# Patient Record
Sex: Female | Born: 1935 | Race: White | Hispanic: No | Smoking: Former smoker
Health system: Southern US, Community
[De-identification: ages and names within clinical notes are randomized; demographics above are authoritative.]

## PROBLEM LIST (undated history)

## (undated) DIAGNOSIS — K589 Irritable bowel syndrome without diarrhea: Secondary | ICD-10-CM

## (undated) DIAGNOSIS — A09 Infectious gastroenteritis and colitis, unspecified: Secondary | ICD-10-CM

## (undated) DIAGNOSIS — N289 Disorder of kidney and ureter, unspecified: Secondary | ICD-10-CM

## (undated) DIAGNOSIS — E785 Hyperlipidemia, unspecified: Secondary | ICD-10-CM

## (undated) DIAGNOSIS — K529 Noninfective gastroenteritis and colitis, unspecified: Secondary | ICD-10-CM

## (undated) DIAGNOSIS — E119 Type 2 diabetes mellitus without complications: Secondary | ICD-10-CM

## (undated) DIAGNOSIS — R110 Nausea: Secondary | ICD-10-CM

## (undated) DIAGNOSIS — I10 Essential (primary) hypertension: Secondary | ICD-10-CM

## (undated) DIAGNOSIS — K219 Gastro-esophageal reflux disease without esophagitis: Secondary | ICD-10-CM

## (undated) DIAGNOSIS — K5790 Diverticulosis of intestine, part unspecified, without perforation or abscess without bleeding: Secondary | ICD-10-CM

## (undated) HISTORY — PX: CHOLECYSTECTOMY: SHX55

## (undated) HISTORY — PX: UPPER GASTROINTESTINAL ENDOSCOPY: SHX188

## (undated) HISTORY — PX: ABDOMINAL HYSTERECTOMY: SHX81

## (undated) HISTORY — PX: OTHER SURGICAL HISTORY: SHX169

## (undated) HISTORY — PX: COLONOSCOPY: SHX174

## (undated) HISTORY — PX: BACK SURGERY: SHX140

---

## 2008-06-14 ENCOUNTER — Ambulatory Visit (HOSPITAL_COMMUNITY): Admission: RE | Admit: 2008-06-14 | Discharge: 2008-06-14 | Payer: Self-pay | Admitting: Urology

## 2008-06-28 ENCOUNTER — Ambulatory Visit (HOSPITAL_BASED_OUTPATIENT_CLINIC_OR_DEPARTMENT_OTHER): Admission: RE | Admit: 2008-06-28 | Discharge: 2008-06-28 | Payer: Self-pay | Admitting: Urology

## 2009-04-10 IMAGING — CR DG CHEST 2V
2 series · 2 of 2 positions shown · non-contrast
Comparison: No priors

CLINICAL DATA: Preop for ureteral stent repositioning

CHEST - 2 VIEW

[w chest pa]
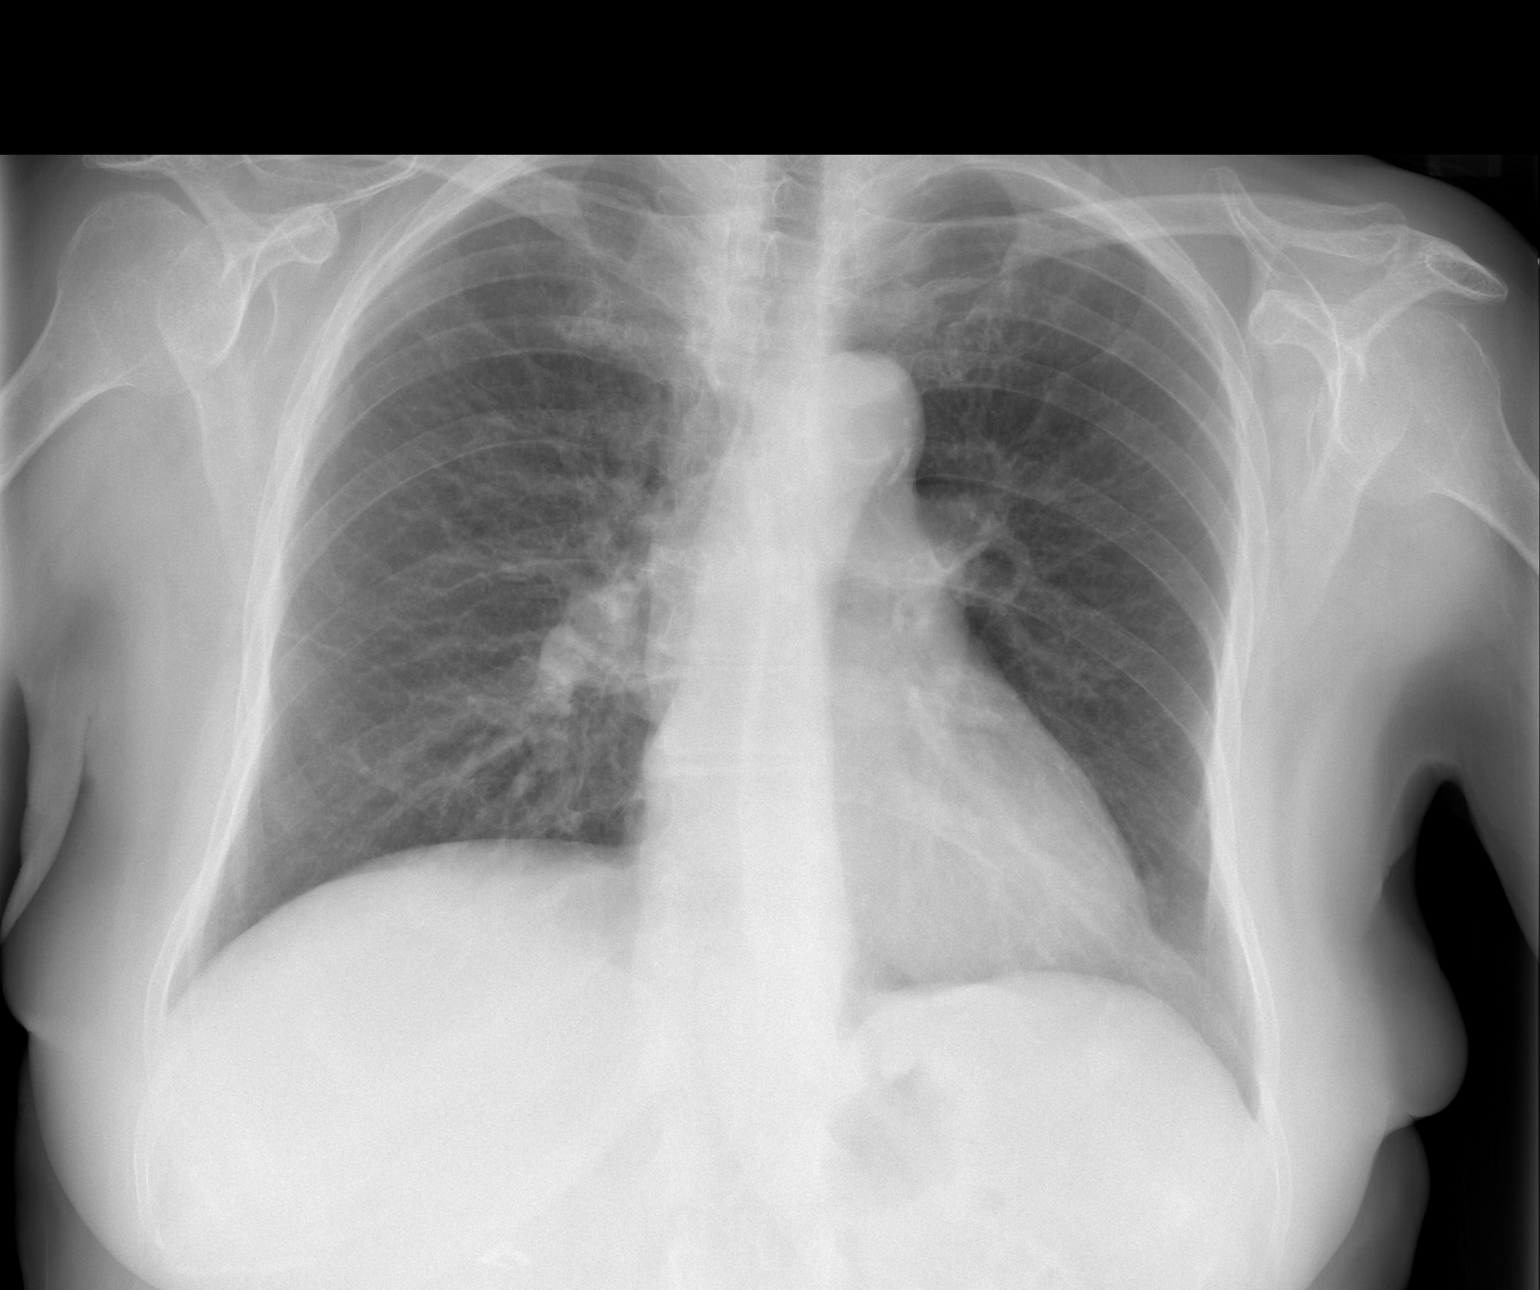

[w chest lat]
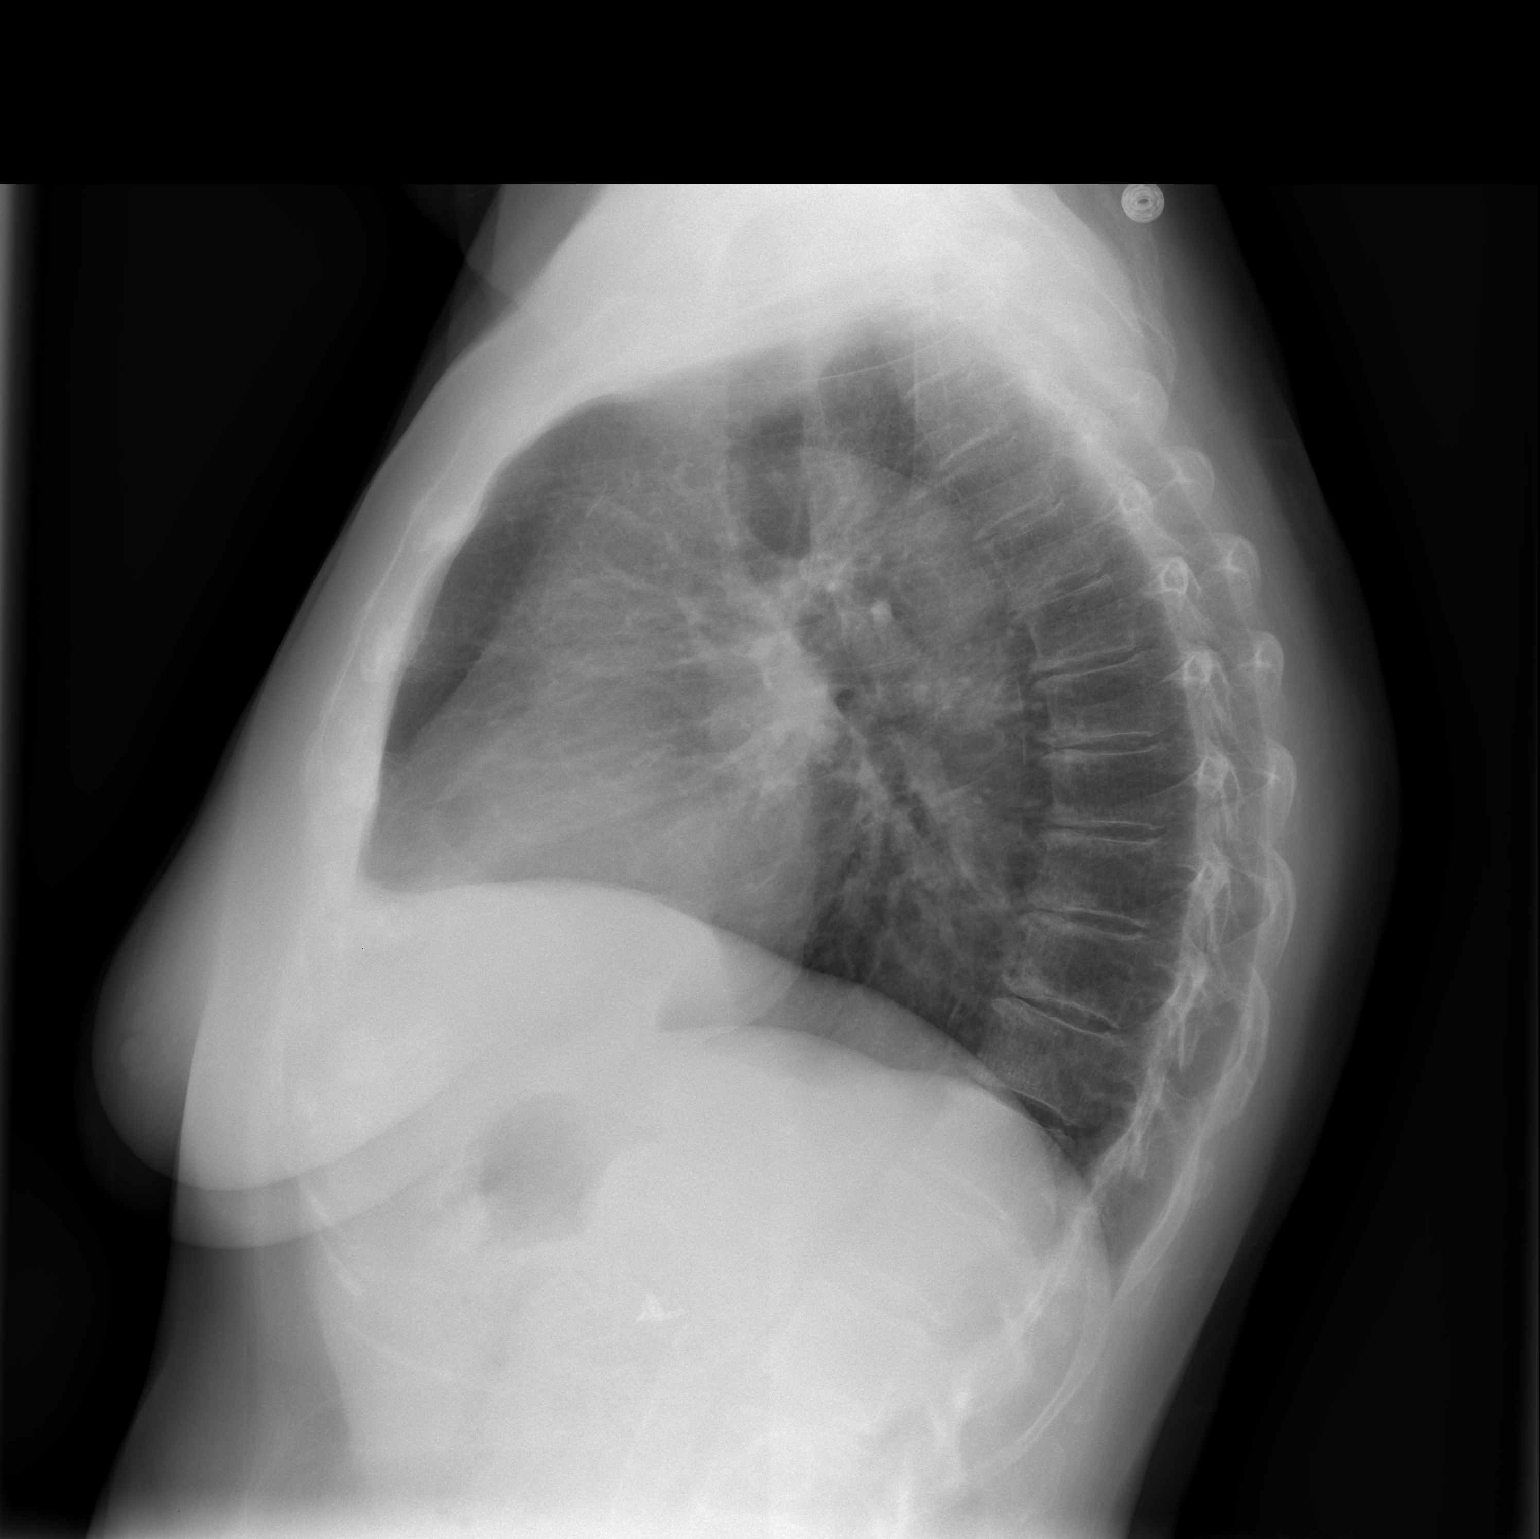

[2 of 2 positions shown; findings below may reference images not displayed]

FINDINGS: Heart and mediastinal contours normal.  Lungs clear.
Osseous structures intact.
IMPRESSION: No active disease.

## 2011-05-15 NOTE — Op Note (Signed)
NAME:  Jodi Webb, Jodi Webb                ACCOUNT NO.:  [id number]   MEDICAL RECORD NO.:  [id number]          PATIENT TYPE:  AMB   LOCATION:  [facility]                         FACILITY:  [facility]   PHYSICIAN:  Mark C. Vernie Ammons, M.D.  DATE OF BIRTH:  [birth year]   DATE OF PROCEDURE:  06/28/2008  DATE OF DISCHARGE:                               OPERATIVE REPORT   PREOPERATIVE DIAGNOSIS:  Retained/migrated right ureteral stent.   POSTOPERATIVE DIAGNOSIS:  Retained/migrated right ureteral stent.   PROCEDURE:  Cystoscopy and stent removal.   SURGEON:  Mark C. Vernie Ammons, M.D.   ANESTHESIA:  General.   SPECIMENS:  None.   BLOOD LOSS:  None.   DRAINS:  None.   COMPLICATIONS:  None.   INDICATIONS:  The patient is a 77 year old white female who had an  emergent right ureteral stent placed in [place] due to urosepsis  due to an obstructing stone.  She was treated and her stone remained  untreated in her right kidney, but no obstruction.  She therefore  underwent lithotripsy with excellent fragmentation of the stone and  returned to my office to have her stent removed.  When I performed  cystoscopy there, the stent could not be visualized, nor was there any  string/tether visible within the bladder.  I therefore brought her to  the operating room for ureteroscopic extraction of the stent.  The  risks, complications and alternatives were discussed.  She understands  and elected to proceed.   DESCRIPTION OF OPERATION:  After informed consent, patient brought to  the major OR, placed on the table, administered general anesthesia and  then moved in the dorsal lithotomy position.  Her genitalia were  sterilely prepped and draped and an official time-out was then  performed.  The 22-French cystoscope was then introduced into the  bladder under direct visualization.  The bladder was noted to be free of  any tumor, stones or inflammatory lesions, although there was some  inflammation in the area of the  right ureteral orifice.  I identified  string/tether exiting the right orifice that was not visible in my  office the other day.  I therefore used the alligator forceps to grasp  the tether and extracted the stent completely and without difficulty.  The bladder was then drained, and the patient was awakened and taken to  recovery room in stable satisfactory condition.  She tolerated the  procedure well.  There were no intraoperative complications.   She will follow-up with me in 1 week to make sure that her bladder  infection, that she was previously noted to have and is currently being  treated with antibiotics, has cleared.      Mark C. Vernie Ammons, M.D.  Electronically Signed     MCO/MEDQ  D:  06/28/2008  T:  06/28/2008  Job:  [id number]

## 2011-09-27 LAB — POCT I-STAT 4, (NA,K, GLUC, HGB,HCT)
Glucose, Bld: 117 — ABNORMAL HIGH
HCT: 41
Hemoglobin: 13.9
Operator id: 268271
Potassium: 3.5
Sodium: 143

## 2013-01-17 DIAGNOSIS — I219 Acute myocardial infarction, unspecified: Secondary | ICD-10-CM

## 2013-10-01 ENCOUNTER — Encounter (INDEPENDENT_AMBULATORY_CARE_PROVIDER_SITE_OTHER): Payer: Self-pay | Admitting: *Deleted

## 2013-10-01 ENCOUNTER — Encounter (INDEPENDENT_AMBULATORY_CARE_PROVIDER_SITE_OTHER): Payer: Self-pay

## 2013-10-15 ENCOUNTER — Other Ambulatory Visit (INDEPENDENT_AMBULATORY_CARE_PROVIDER_SITE_OTHER): Payer: Self-pay | Admitting: *Deleted

## 2013-10-15 ENCOUNTER — Telehealth (INDEPENDENT_AMBULATORY_CARE_PROVIDER_SITE_OTHER): Payer: Self-pay | Admitting: *Deleted

## 2013-10-15 DIAGNOSIS — Z800 Family history of malignant neoplasm of digestive organs: Secondary | ICD-10-CM

## 2013-10-15 DIAGNOSIS — Z1211 Encounter for screening for malignant neoplasm of colon: Secondary | ICD-10-CM

## 2013-10-15 MED ORDER — PEG-KCL-NACL-NASULF-NA ASC-C 100 G PO SOLR: 1.0000 | Freq: Once | ORAL | Status: DC

## 2013-10-15 NOTE — Telephone Encounter (Signed)
Patient needs movi prep 

## 2013-10-20 ENCOUNTER — Encounter (INDEPENDENT_AMBULATORY_CARE_PROVIDER_SITE_OTHER): Payer: Self-pay | Admitting: *Deleted

## 2013-11-02 ENCOUNTER — Telehealth (INDEPENDENT_AMBULATORY_CARE_PROVIDER_SITE_OTHER): Payer: Self-pay | Admitting: *Deleted

## 2013-11-02 NOTE — Telephone Encounter (Signed)
  Procedure: tcs  Reason/Indication:  fam hx colon ca  Has patient had this procedure before?  Yes, 2009 (scanned)  If so, when, by whom and where?    Is there a family history of colon cancer?  Yes, sister  Who?  What age when diagnosed?    Is patient diabetic?   no      Does patient have prosthetic heart valve?  no  Do you have a pacemaker?  no  Has patient ever had endocarditis? no  Has patient had joint replacement within last 12 months?  no  Does patient tend to be constipated or take laxatives? no  Is patient on Coumadin, Plavix and/or Aspirin? no  Medications: fish oil 1000 mg daily, multi vit daily, potassium bid, vit b3 daily, probiotics daily, cymbalta 60 mg daily, diltiazem 120 mg daily, hctz 12.5 mg daily, xanax prn, nexium 40 mg daily, nitrofurantoin 50 mg daily  Allergies: nkda  Medication Adjustment:   Procedure date & time: 11/25/13 at 1200

## 2013-11-04 NOTE — Telephone Encounter (Signed)
agree

## 2013-11-18 ENCOUNTER — Encounter (HOSPITAL_COMMUNITY): Payer: Self-pay | Admitting: Pharmacy Technician

## 2013-12-14 ENCOUNTER — Encounter (INDEPENDENT_AMBULATORY_CARE_PROVIDER_SITE_OTHER): Payer: Self-pay | Admitting: *Deleted

## 2013-12-30 ENCOUNTER — Telehealth (INDEPENDENT_AMBULATORY_CARE_PROVIDER_SITE_OTHER): Payer: Self-pay | Admitting: *Deleted

## 2013-12-30 NOTE — Telephone Encounter (Signed)
  Procedure: tcs  Reason/Indication:  fam hx colon ca  Has patient had this procedure before?  Yes, 2009 --scanned  If so, when, by whom and where?    Is there a family history of colon cancer?  Yes, sister  Who?  What age when diagnosed?    Is patient diabetic?   no      Does patient have prosthetic heart valve?  no  Do you have a pacemaker?  no  Has patient ever had endocarditis? no  Has patient had joint replacement within last 12 months?  no  Does patient tend to be constipated or take laxatives? no  Is patient on Coumadin, Plavix and/or Aspirin? no  Medications: fish oil 1000 mg daily, multi vit daily, potassium bid, vit b3 daily, probiotics daily, Cymbalta 60 mg daily, diltiazem 120 mg daily, hctz 12.5 mg daily, xanax prn, nexium 40 mg daily, nitrofurantoin 50 mg daily  Allergies: nkda  Medication Adjustment:   Procedure date & time: 01/28/14 at 730

## 2013-12-30 NOTE — Telephone Encounter (Signed)
agree

## 2014-01-28 ENCOUNTER — Encounter (HOSPITAL_COMMUNITY): Admission: RE | Payer: Self-pay | Source: Ambulatory Visit

## 2014-01-28 ENCOUNTER — Ambulatory Visit (HOSPITAL_COMMUNITY): Admission: RE | Admit: 2014-01-28 | Payer: Medicare Other | Source: Ambulatory Visit | Admitting: Internal Medicine

## 2014-01-28 SURGERY — COLONOSCOPY
Anesthesia: Moderate Sedation

## 2016-03-31 ENCOUNTER — Emergency Department (HOSPITAL_COMMUNITY): Payer: Medicare Other

## 2016-03-31 ENCOUNTER — Inpatient Hospital Stay (HOSPITAL_COMMUNITY)
Admission: EM | Admit: 2016-03-31 | Discharge: 2016-04-04 | DRG: 391 | Disposition: A | Discharge status: Home / Self Care | Payer: Medicare Other | Attending: Internal Medicine | Admitting: Internal Medicine

## 2016-03-31 ENCOUNTER — Encounter (HOSPITAL_COMMUNITY): Payer: Self-pay | Admitting: *Deleted

## 2016-03-31 DIAGNOSIS — Z87440 Personal history of urinary (tract) infections: Secondary | ICD-10-CM

## 2016-03-31 DIAGNOSIS — Z90710 Acquired absence of both cervix and uterus: Secondary | ICD-10-CM

## 2016-03-31 DIAGNOSIS — E119 Type 2 diabetes mellitus without complications: Secondary | ICD-10-CM

## 2016-03-31 DIAGNOSIS — J9611 Chronic respiratory failure with hypoxia: Secondary | ICD-10-CM | POA: Diagnosis present

## 2016-03-31 DIAGNOSIS — I10 Essential (primary) hypertension: Secondary | ICD-10-CM | POA: Diagnosis not present

## 2016-03-31 DIAGNOSIS — Z9049 Acquired absence of other specified parts of digestive tract: Secondary | ICD-10-CM

## 2016-03-31 DIAGNOSIS — J9691 Respiratory failure, unspecified with hypoxia: Secondary | ICD-10-CM | POA: Diagnosis present

## 2016-03-31 DIAGNOSIS — Z803 Family history of malignant neoplasm of breast: Secondary | ICD-10-CM

## 2016-03-31 DIAGNOSIS — N390 Urinary tract infection, site not specified: Secondary | ICD-10-CM | POA: Diagnosis present

## 2016-03-31 DIAGNOSIS — N289 Disorder of kidney and ureter, unspecified: Secondary | ICD-10-CM | POA: Insufficient documentation

## 2016-03-31 DIAGNOSIS — K589 Irritable bowel syndrome without diarrhea: Secondary | ICD-10-CM | POA: Insufficient documentation

## 2016-03-31 DIAGNOSIS — R0902 Hypoxemia: Secondary | ICD-10-CM

## 2016-03-31 DIAGNOSIS — A09 Infectious gastroenteritis and colitis, unspecified: Secondary | ICD-10-CM | POA: Diagnosis not present

## 2016-03-31 DIAGNOSIS — E785 Hyperlipidemia, unspecified: Secondary | ICD-10-CM | POA: Diagnosis present

## 2016-03-31 DIAGNOSIS — K921 Melena: Secondary | ICD-10-CM | POA: Diagnosis present

## 2016-03-31 DIAGNOSIS — K529 Noninfective gastroenteritis and colitis, unspecified: Secondary | ICD-10-CM

## 2016-03-31 DIAGNOSIS — D62 Acute posthemorrhagic anemia: Secondary | ICD-10-CM | POA: Diagnosis present

## 2016-03-31 DIAGNOSIS — Z800 Family history of malignant neoplasm of digestive organs: Secondary | ICD-10-CM

## 2016-03-31 DIAGNOSIS — Z87442 Personal history of urinary calculi: Secondary | ICD-10-CM

## 2016-03-31 DIAGNOSIS — Z801 Family history of malignant neoplasm of trachea, bronchus and lung: Secondary | ICD-10-CM

## 2016-03-31 DIAGNOSIS — K219 Gastro-esophageal reflux disease without esophagitis: Secondary | ICD-10-CM | POA: Diagnosis present

## 2016-03-31 DIAGNOSIS — K5730 Diverticulosis of large intestine without perforation or abscess without bleeding: Secondary | ICD-10-CM | POA: Diagnosis present

## 2016-03-31 DIAGNOSIS — K5790 Diverticulosis of intestine, part unspecified, without perforation or abscess without bleeding: Secondary | ICD-10-CM | POA: Insufficient documentation

## 2016-03-31 DIAGNOSIS — Z87891 Personal history of nicotine dependence: Secondary | ICD-10-CM

## 2016-03-31 DIAGNOSIS — E782 Mixed hyperlipidemia: Secondary | ICD-10-CM | POA: Insufficient documentation

## 2016-03-31 HISTORY — DX: Diverticulosis of intestine, part unspecified, without perforation or abscess without bleeding: K57.90

## 2016-03-31 HISTORY — DX: Disorder of kidney and ureter, unspecified: N28.9

## 2016-03-31 HISTORY — DX: Hyperlipidemia, unspecified: E78.5

## 2016-03-31 HISTORY — DX: Infectious gastroenteritis and colitis, unspecified: A09

## 2016-03-31 HISTORY — DX: Irritable bowel syndrome, unspecified: K58.9

## 2016-03-31 HISTORY — DX: Noninfective gastroenteritis and colitis, unspecified: K52.9

## 2016-03-31 HISTORY — DX: Essential (primary) hypertension: I10

## 2016-03-31 HISTORY — DX: Type 2 diabetes mellitus without complications: E11.9

## 2016-03-31 HISTORY — DX: Gastro-esophageal reflux disease without esophagitis: K21.9

## 2016-03-31 LAB — COMPREHENSIVE METABOLIC PANEL
ALBUMIN: 4.2 g/dL (ref 3.5–5.0)
ALT: 28 U/L (ref 14–54)
ANION GAP: 10 (ref 5–15)
AST: 31 U/L (ref 15–41)
Alkaline Phosphatase: 80 U/L (ref 38–126)
BUN: 21 mg/dL — AB (ref 6–20)
CO2: 26 mmol/L (ref 22–32)
Calcium: 9.2 mg/dL (ref 8.9–10.3)
Chloride: 104 mmol/L (ref 101–111)
Creatinine, Ser: 0.67 mg/dL (ref 0.44–1.00)
GFR calc Af Amer: >60 mL/min (ref >60)
GFR calc non Af Amer: >60 mL/min (ref >60)
GLUCOSE: 151 mg/dL — AB (ref 65–99)
POTASSIUM: 4.3 mmol/L (ref 3.5–5.1)
SODIUM: 140 mmol/L (ref 135–145)
Total Bilirubin: 0.5 mg/dL (ref 0.3–1.2)
Total Protein: 7.8 g/dL (ref 6.5–8.1)

## 2016-03-31 LAB — URINALYSIS, ROUTINE W REFLEX MICROSCOPIC
Bilirubin Urine: NEGATIVE
Glucose, UA: NEGATIVE mg/dL
KETONES UR: NEGATIVE mg/dL
NITRITE: POSITIVE — AB
PROTEIN: NEGATIVE mg/dL
Specific Gravity, Urine: 1.02 (ref 1.005–1.030)
pH: 5 (ref 5.0–8.0)

## 2016-03-31 LAB — CBC
HEMATOCRIT: 42.9 % (ref 36.0–46.0)
HEMOGLOBIN: 13.7 g/dL (ref 12.0–15.0)
MCH: 29.8 pg (ref 26.0–34.0)
MCHC: 31.9 g/dL (ref 30.0–36.0)
MCV: 93.3 fL (ref 78.0–100.0)
PLATELETS: 253 10*3/uL (ref 150–400)
RBC: 4.6 MIL/uL (ref 3.87–5.11)
RDW: 13.4 % (ref 11.5–15.5)
WBC: 20.4 10*3/uL — ABNORMAL HIGH (ref 4.0–10.5)

## 2016-03-31 LAB — URINE MICROSCOPIC-ADD ON

## 2016-03-31 LAB — LIPASE, BLOOD: Lipase: 25 U/L (ref 11–51)

## 2016-03-31 LAB — LACTIC ACID, PLASMA
LACTIC ACID, VENOUS: 1.9 mmol/L (ref 0.5–2.0)
LACTIC ACID, VENOUS: 1.3 mmol/L (ref 0.5–2.0)

## 2016-03-31 LAB — POC OCCULT BLOOD, ED: Fecal Occult Bld: POSITIVE — AB

## 2016-03-31 MED ORDER — MORPHINE SULFATE (PF) 2 MG/ML IV SOLN
2.0000 mg | INTRAVENOUS | Status: AC | PRN
  Administered 2016-03-31 (×2): 2 mg via INTRAVENOUS
  Filled 2016-03-31 (×2): qty 1

## 2016-03-31 MED ORDER — PROMETHAZINE HCL 12.5 MG PO TABS: 25.0000 mg | ORAL_TABLET | Freq: Four times a day (QID) | ORAL | Status: DC | PRN

## 2016-03-31 MED ORDER — DIATRIZOATE MEGLUMINE & SODIUM 66-10 % PO SOLN
ORAL | Status: AC
  Filled 2016-03-31: qty 30

## 2016-03-31 MED ORDER — IOHEXOL 300 MG/ML  SOLN
75.0000 mL | Freq: Once | INTRAMUSCULAR | Status: AC | PRN
  Administered 2016-03-31: 75 mL via INTRAVENOUS

## 2016-03-31 MED ORDER — METRONIDAZOLE IN NACL 5-0.79 MG/ML-% IV SOLN
500.0000 mg | Freq: Once | INTRAVENOUS | Status: AC
  Administered 2016-03-31: 500 mg via INTRAVENOUS
  Filled 2016-03-31: qty 100

## 2016-03-31 MED ORDER — CIPROFLOXACIN IN D5W 400 MG/200ML IV SOLN
400.0000 mg | Freq: Two times a day (BID) | INTRAVENOUS | Status: DC
  Administered 2016-03-31 – 2016-04-04 (×8): 400 mg via INTRAVENOUS
  Filled 2016-03-31 (×8): qty 200

## 2016-03-31 MED ORDER — ONDANSETRON HCL 4 MG/2ML IJ SOLN
4.0000 mg | INTRAMUSCULAR | Status: DC | PRN
  Administered 2016-03-31: 4 mg via INTRAVENOUS
  Filled 2016-03-31: qty 2

## 2016-03-31 MED ORDER — LISINOPRIL 5 MG PO TABS
5.0000 mg | ORAL_TABLET | Freq: Every morning | ORAL | Status: DC
  Administered 2016-04-01 – 2016-04-04 (×4): 5 mg via ORAL
  Filled 2016-03-31 (×4): qty 1

## 2016-03-31 MED ORDER — ALPRAZOLAM 1 MG PO TABS
1.0000 mg | ORAL_TABLET | Freq: Two times a day (BID) | ORAL | Status: DC
  Administered 2016-03-31 – 2016-04-04 (×8): 1 mg via ORAL
  Filled 2016-03-31 (×8): qty 1

## 2016-03-31 MED ORDER — HYDROCHLOROTHIAZIDE 12.5 MG PO CAPS: 12.5000 mg | ORAL_CAPSULE | Freq: Every day | ORAL | Status: DC

## 2016-03-31 MED ORDER — SODIUM CHLORIDE 0.9 % IV SOLN: INTRAVENOUS | Status: DC

## 2016-03-31 MED ORDER — ACETAMINOPHEN 325 MG PO TABS
650.0000 mg | ORAL_TABLET | Freq: Four times a day (QID) | ORAL | Status: DC | PRN
  Administered 2016-04-01 – 2016-04-03 (×6): 650 mg via ORAL
  Filled 2016-03-31 (×6): qty 2

## 2016-03-31 MED ORDER — ACETAMINOPHEN 650 MG RE SUPP: 650.0000 mg | Freq: Four times a day (QID) | RECTAL | Status: DC | PRN

## 2016-03-31 MED ORDER — METRONIDAZOLE IN NACL 5-0.79 MG/ML-% IV SOLN
500.0000 mg | Freq: Three times a day (TID) | INTRAVENOUS | Status: DC
  Administered 2016-03-31 – 2016-04-03 (×8): 500 mg via INTRAVENOUS
  Filled 2016-03-31 (×9): qty 100

## 2016-03-31 MED ORDER — SODIUM CHLORIDE 0.9 % IV SOLN
INTRAVENOUS | Status: DC
  Administered 2016-03-31: 16:00:00 via INTRAVENOUS

## 2016-03-31 MED ORDER — DULOXETINE HCL 60 MG PO CPEP
60.0000 mg | ORAL_CAPSULE | Freq: Every morning | ORAL | Status: DC
  Administered 2016-04-01 – 2016-04-04 (×4): 60 mg via ORAL
  Filled 2016-03-31 (×4): qty 1

## 2016-03-31 MED ORDER — DILTIAZEM HCL ER COATED BEADS 240 MG PO CP24
240.0000 mg | ORAL_CAPSULE | Freq: Every day | ORAL | Status: DC
  Administered 2016-04-01 – 2016-04-04 (×4): 240 mg via ORAL
  Filled 2016-03-31 (×8): qty 1

## 2016-03-31 MED ORDER — PANTOPRAZOLE SODIUM 40 MG PO TBEC
40.0000 mg | DELAYED_RELEASE_TABLET | Freq: Every morning | ORAL | Status: DC
  Administered 2016-04-01 – 2016-04-04 (×4): 40 mg via ORAL
  Filled 2016-03-31 (×4): qty 1

## 2016-03-31 MED ORDER — ATORVASTATIN CALCIUM 10 MG PO TABS
10.0000 mg | ORAL_TABLET | Freq: Every morning | ORAL | Status: DC
  Administered 2016-04-01 – 2016-04-04 (×4): 10 mg via ORAL
  Filled 2016-03-31 (×5): qty 1

## 2016-03-31 NOTE — H&P (Addendum)
History and Physical  Jodi Webb [id number] DOB: 03-14-1932 DOA: 03/31/2016  Referring physician: Dr Thurnell Garbe, ED physician PCP: [name], DO   Chief Complaint: Abdominal pain, rectal bleeding  HPI: Jodi Webb is a 82 y.o. female  With a history of hypertension, hyperlipidemia, GERD, IBS, history kidney stones. Patient has been having worsening diarrhea over the past week with chills, left lower quadrant abdominal pain and nausea. Her abdominal pain radiating into her back. Not improved with food or liquid. Not improved with stooling. Started having maroon rectal bleeding this morning. Provoking or palliating factors. She has never had this before.   Review of Systems:   Pt denies any fevers, constipation, shortness of breath, dyspnea on exertion, orthopnea, cough, wheezing, palpitations, headache, vision changes, lightheadedness, dizziness, diarrhea, constipation, melena, rectal bleeding.  Review of systems are otherwise negative  Past Medical History  Diagnosis Date  . Hyperlipemia   . GERD (gastroesophageal reflux disease)   . IBS (irritable bowel syndrome)   . Chronic diarrhea   . Diverticulosis   . Diabetes mellitus without complication ([place])   . Hypertension   . Renal disorder     kidney stones   Past Surgical History  Procedure Laterality Date  . Back surgery    . Cholecystectomy    . Abdominal hysterectomy    . Kidney stents     Social History:  reports that she has quit smoking. She does not have any smokeless tobacco history on file. She reports that she does not drink alcohol or use illicit drugs. Patient lives at home & is able to participate in activities of daily living   No Known Allergies  Patient not aware of family history  Prior to Admission medications   Medication Sig Start Date End Date Taking? Authorizing Provider  acidophilus (RISAQUAD) CAPS capsule Take 1 capsule by mouth daily.   Yes Historical Provider, MD  ALPRAZolam Duanne Moron)  1 MG tablet Take 1 mg by mouth 2 (two) times daily.   Yes Historical Provider, MD  aspirin EC 81 MG tablet Take 81 mg by mouth every morning.   Yes Historical Provider, MD  atorvastatin (LIPITOR) 10 MG tablet Take 10 mg by mouth every morning.   Yes Historical Provider, MD  cholecalciferol (VITAMIN D) 1000 UNITS tablet Take 5,000 Units by mouth daily.   Yes Historical Provider, MD  Cyanocobalamin (VITAMIN B-12 PO) Take 1 tablet by mouth daily.   Yes Historical Provider, MD  diclofenac (VOLTAREN) 75 MG EC tablet Take 75 mg by mouth 2 (two) times daily.   Yes Historical Provider, MD  diltiazem (DILACOR XR) 240 MG 24 hr capsule Take 240 mg by mouth daily.   Yes Historical Provider, MD  DULoxetine (CYMBALTA) 60 MG capsule Take 60 mg by mouth every morning.    Yes Historical Provider, MD  hydrochlorothiazide (MICROZIDE) 12.5 MG capsule Take 12.5 mg by mouth daily.   Yes Historical Provider, MD  lisinopril (PRINIVIL,ZESTRIL) 5 MG tablet Take 5 mg by mouth every morning.   Yes Historical Provider, MD  Multiple Vitamins-Minerals (MULTIVITAMINS THER. W/MINERALS) TABS tablet Take 1 tablet by mouth daily.   Yes Historical Provider, MD  Omega-3 Fatty Acids (FISH OIL PO) Take 1 capsule by mouth daily.   Yes Historical Provider, MD  pantoprazole (PROTONIX) 40 MG tablet Take 40 mg by mouth every morning.   Yes Historical Provider, MD  promethazine (PHENERGAN) 25 MG tablet Take 25 mg by mouth every 6 (six) hours as needed for nausea or vomiting.  Yes Historical Provider, MD    Physical Exam: BP 142/78 mmHg  Pulse 67  Temp(Src) 98.8 F (37.1 C) (Oral)  Resp 16  Ht 5\' 1"  (1.549 m)  Wt 77.111 kg (170 lb)  BMI 32.14 kg/m2  SpO2 98%  General: Elderly Caucasian female. Awake and alert and oriented x3. No acute cardiopulmonary distress.  Eyes: Pupils equal, round, reactive to light. Extraocular muscles are intact. Sclerae anicteric and noninjected.  ENT:  Moist mucosal membranes. No mucosal lesions.   Neck:  Neck supple without lymphadenopathy. No carotid bruits. No masses palpated.  Cardiovascular: Regular rate with normal S1-S2 sounds. No murmurs, rubs, gallops auscultated. No JVD.  Respiratory: Good respiratory effort with no wheezes, rales, rhonchi. Lungs clear to auscultation bilaterally.  Abdomen: Soft, nondistended. Mild tenderness in the right and left lower quadrants. No guarding or rebound tenderness. Active bowel sounds. No masses or hepatosplenomegaly  Skin: Dry, warm to touch. 2+ dorsalis pedis and radial pulses. Musculoskeletal: No calf or leg pain. All major joints not erythematous nontender.  Psychiatric: Intact judgment and insight.  Neurologic: No focal neurological deficits. Cranial nerves II through XII are grossly intact.           Labs on Admission:  Basic Metabolic Panel:  Recent Labs Lab 03/31/16 1433  NA 140  K 4.3  CL 104  CO2 26  GLUCOSE 151*  BUN 21*  CREATININE 0.67  CALCIUM 9.2   Liver Function Tests:  Recent Labs Lab 03/31/16 1433  AST 31  ALT 28  ALKPHOS 80  BILITOT 0.5  PROT 7.8  ALBUMIN 4.2    Recent Labs Lab 03/31/16 1433  LIPASE 25   No results for input(s): AMMONIA in the last 168 hours. CBC:  Recent Labs Lab 03/31/16 1433  WBC 20.4*  HGB 13.7  HCT 42.9  MCV 93.3  PLT 253   Cardiac Enzymes: No results for input(s): CKTOTAL, CKMB, CKMBINDEX, TROPONINI in the last 168 hours.  BNP (last 3 results) No results for input(s): BNP in the last 8760 hours.  ProBNP (last 3 results) No results for input(s): PROBNP in the last 8760 hours.  CBG: No results for input(s): GLUCAP in the last 168 hours.  Radiological Exams on Admission: Ct Abdomen Pelvis W Contrast  03/31/2016  CLINICAL DATA:  Diarrhea since the beginning of the week with lower abdominal pain starting yesterday, bright red blood in stools this morning. History of kidney stones, high BS, diverticulitis and lumbar fracture. EXAM: CT ABDOMEN AND PELVIS WITH CONTRAST  TECHNIQUE: Multidetector CT imaging of the abdomen and pelvis was performed using the standard protocol following bolus administration of intravenous contrast. CONTRAST:  26mL OMNIPAQUE IOHEXOL 300 MG/ML  SOLN COMPARISON:  Chest and abdominal x-rays dated 06/28/2008. FINDINGS: Lower chest: Hiatal hernia, moderate in size. Adjacent lung bases are clear. Hepatobiliary: Status post cholecystectomy with associated bile duct ectasia. Liver appears normal, perhaps mild fatty infiltration. Pancreas: No mass, inflammatory changes, or other significant abnormality. Spleen: Within normal limits in size and appearance. Adrenals/Urinary Tract: Adrenal glands appear normal. Multiple renal cysts bilaterally, largest is on the left measuring 7 cm greatest dimension. 2 mm left renal stone. No ureteral or bladder calculi identified. Bladder appears normal, partially decompressed. Stomach/Bowel: There is thickening of the walls of a long segment of the sigmoid and descending colon, involving the mid descending colon to the lower sigmoid colon, suggesting acute colitis of infectious, inflammatory or ischemic nature. Scattered diverticulosis noted within the sigmoid colon. No dilated large or small bowel  loops. Appendix is not seen but there are no inflammatory changes about the cecum to suggest acute appendicitis. Vascular/Lymphatic: Prominent atherosclerotic changes of the normal- aorta and pelvic vasculature. No acute-appearing vascular abnormality seen. No enlarged lymph nodes seen. Reproductive: Status post hysterectomy. Other: No free fluid or abscess collection seen. No free intraperitoneal air. Musculoskeletal: No acute or suspicious osseous lesion. Degenerative change throughout the thoracolumbar spine, moderate in degree, with associated disc bulges at multiple levels of the lumbar spine causing mild-to-moderate degrees of central canal stenosis, most significant stenosis at the L5-S1 level with possible associated nerve root  impingement. IMPRESSION: 1. Thickening of the walls of a long segment of the descending and sigmoid colon, compatible with acute colitis of infectious, inflammatory or ischemic nature. There is associated paracolic inflammation. No abscess collection. No evidence of bowel perforation. No associated bowel obstruction. 2. Colonic diverticulosis. 3. Bilateral renal cysts. Also 2 mm nonobstructing left renal stone. 4. Hiatal hernia, moderate in size. 5. Probable mild fatty infiltration of the liver. 6. Fairly prominent atherosclerotic changes of the abdominal aorta and pelvic vasculature. No acute -appearing vascular abnormality seen. No aortic aneurysm. 7. Degenerative changes of the thoracolumbar spine, as detailed above. Electronically Signed   By: Franki Cabot M.D.   On: 03/31/2016 17:08    Assessment/Plan Present on Admission:  . Colitis . Hypertension . GERD (gastroesophageal reflux disease)  This patient was discussed with the ED physician, including pertinent vitals, physical exam findings, labs, and imaging.  We also discussed care given by the ED provider.  #1 colitis  Admit to MedSurg  No abscess or perforation evident on CT  Continue Cipro and Flagyl  Recheck CBC in the morning  Check metabolic panel in the morning  Stool culture sent  NPO with sips and meds #2 hypertension  Continue antihypertensives #3 GERD  Continue Protonix 4 IVS  Patient has diarrhea predominant IBS.  DVT prophylaxis: SCDs  Consultants: None  Code Status: Full code  Family Communication: Daughter in the room   Disposition Plan: Admission   Truett Mainland, DO [facility] Pager [phone]

## 2016-03-31 NOTE — ED Notes (Signed)
MD at bedside. 

## 2016-03-31 NOTE — ED Notes (Signed)
Pt has been having diarrhea since the beginning of the week with lower abdominal pain starting yesterday. Pt states she began having bright red blood in her stools this morning. Pt is alert and oriented in triage with VS.

## 2016-03-31 NOTE — ED Provider Notes (Signed)
CSN: [id number]     Arrival date & time 03/31/16  1353 History   First MD Initiated Contact with Patient 03/31/16 1520     Chief Complaint  Patient presents with  . Abdominal Pain      HPI Pt was seen at 1525. Per pt, c/o gradual onset and persistence of multiple intermittent episodes of diarrhea for the past 1 week.   Describes the stools as "loose" then "watery." Pt states since yesterday, she has had lower abd "pain" as well as "red blood" in her stools and on the toilet paper after having a BM. States her lower abd pain radiates into her lower back. Endorses "some" dysuria. Denies CP/SOB, no hematuria, no fevers, no black stools, no N/V.     Past Medical History  Diagnosis Date  . Diabetes mellitus without complication ([place])   . Hypertension   . Renal disorder     kidney stones  . Hyperlipemia   . GERD (gastroesophageal reflux disease)   . IBS (irritable bowel syndrome)   . Chronic diarrhea   . Diverticulosis    Past Surgical History  Procedure Laterality Date  . Back surgery    . Cholecystectomy    . Abdominal hysterectomy    . Kidney stents      Social History  Substance Use Topics  . Smoking status: Former Research scientist (life sciences)  . Smokeless tobacco: None  . Alcohol Use: No    Review of Systems ROS: Statement: All systems negative except as marked or noted in the HPI; Constitutional: Negative for fever and chills. ; ; Eyes: Negative for eye pain, redness and discharge. ; ; ENMT: Negative for ear pain, hoarseness, nasal congestion, sinus pressure and sore throat. ; ; Cardiovascular: Negative for chest pain, palpitations, diaphoresis, dyspnea and peripheral edema. ; ; Respiratory: Negative for cough, wheezing and stridor. ; ; Gastrointestinal: Negative for nausea, vomiting, hematemesis, jaundice and +abd pain, diarrhea, rectal bleeding. . ; ; Genitourinary: +dysuria. Negative for flank pain and hematuria. ; ; Musculoskeletal: Negative for back pain and neck pain. Negative for swelling  and trauma.; ; Skin: Negative for pruritus, rash, abrasions, blisters, bruising and skin lesion.; ; Neuro: Negative for headache, lightheadedness and neck stiffness. Negative for weakness, altered level of consciousness , altered mental status, extremity weakness, paresthesias, involuntary movement, seizure and syncope.      Allergies  Review of patient's allergies indicates no known allergies.  Home Medications   Prior to Admission medications   Medication Sig Start Date End Date Taking? Authorizing Provider  acidophilus (RISAQUAD) CAPS capsule Take 1 capsule by mouth daily.   Yes Historical Provider, MD  ALPRAZolam Duanne Moron) 1 MG tablet Take 1 mg by mouth 2 (two) times daily.   Yes Historical Provider, MD  aspirin EC 81 MG tablet Take 81 mg by mouth every morning.   Yes Historical Provider, MD  atorvastatin (LIPITOR) 10 MG tablet Take 10 mg by mouth every morning.   Yes Historical Provider, MD  cholecalciferol (VITAMIN D) 1000 UNITS tablet Take 5,000 Units by mouth daily.   Yes Historical Provider, MD  Cyanocobalamin (VITAMIN B-12 PO) Take 1 tablet by mouth daily.   Yes Historical Provider, MD  diclofenac (VOLTAREN) 75 MG EC tablet Take 75 mg by mouth 2 (two) times daily.   Yes Historical Provider, MD  diltiazem (DILACOR XR) 240 MG 24 hr capsule Take 240 mg by mouth daily.   Yes Historical Provider, MD  DULoxetine (CYMBALTA) 60 MG capsule Take 60 mg by mouth every morning.  Yes Historical Provider, MD  hydrochlorothiazide (MICROZIDE) 12.5 MG capsule Take 12.5 mg by mouth daily.   Yes Historical Provider, MD  lisinopril (PRINIVIL,ZESTRIL) 5 MG tablet Take 5 mg by mouth every morning.   Yes Historical Provider, MD  Multiple Vitamins-Minerals (MULTIVITAMINS THER. W/MINERALS) TABS tablet Take 1 tablet by mouth daily.   Yes Historical Provider, MD  Omega-3 Fatty Acids (FISH OIL PO) Take 1 capsule by mouth daily.   Yes Historical Provider, MD  pantoprazole (PROTONIX) 40 MG tablet Take 40 mg by  mouth every morning.   Yes Historical Provider, MD  promethazine (PHENERGAN) 25 MG tablet Take 25 mg by mouth every 6 (six) hours as needed for nausea or vomiting.   Yes Historical Provider, MD   BP 158/79 mmHg  Pulse 100  Temp(Src) 98.8 F (37.1 C) (Oral)  Resp 16  Ht 5\' 1"  (1.549 m)  Wt 170 lb (77.111 kg)  BMI 32.14 kg/m2  SpO2 97% Physical Exam  1530: Physical examination:  Nursing notes reviewed; Vital signs and O2 SAT reviewed;  Constitutional: Well developed, Well nourished, Well hydrated, In no acute distress; Head:  Normocephalic, atraumatic; Eyes: EOMI, PERRL, No scleral icterus; ENMT: Mouth and pharynx normal, Mucous membranes moist; Neck: Supple, Full range of motion, No lymphadenopathy; Cardiovascular: Regular rate and rhythm, No gallop; Respiratory: Breath sounds clear & equal bilaterally, No wheezes.  Speaking full sentences with ease, Normal respiratory effort/excursion; Chest: Nontender, Movement normal; Abdomen: Soft, +RLQ, suprapubic, LLQ tender to palp. No rebound or guarding. Nondistended, Normal bowel sounds. Rectal exam performed w/permission of pt and ED RN chaperone present.  Anal tone normal.  Non-tender, soft maroon stool in rectal vault, heme positive.  No fissures, +external hemorrhoids without thrombosis or bleeding, no palp masses.; Genitourinary: No CVA tenderness; Extremities: Pulses normal, No tenderness, No edema, No calf edema or asymmetry.; Neuro: AA&Ox3, vague historian. Major CN grossly intact.  Speech clear. No gross focal motor or sensory deficits in extremities.; Skin: Color normal, Warm, Dry.   ED Course  Procedures (including critical care time) Labs Review   Imaging Review  I have personally reviewed and evaluated these images and lab results as part of my medical decision-making.   EKG Interpretation None      MDM  MDM Reviewed: previous chart, nursing note and vitals Reviewed previous: labs Interpretation: labs and CT  scan     Results for orders placed or performed during the hospital encounter of 03/31/16  Lipase, blood  Result Value Ref Range   Lipase 25 11 - 51 U/L  Comprehensive metabolic panel  Result Value Ref Range   Sodium 140 135 - 145 mmol/L   Potassium 4.3 3.5 - 5.1 mmol/L   Chloride 104 101 - 111 mmol/L   CO2 26 22 - 32 mmol/L   Glucose, Bld 151 (H) 65 - 99 mg/dL   BUN 21 (H) 6 - 20 mg/dL   Creatinine, Ser 0.67 0.44 - 1.00 mg/dL   Calcium 9.2 8.9 - 10.3 mg/dL   Total Protein 7.8 6.5 - 8.1 g/dL   Albumin 4.2 3.5 - 5.0 g/dL   AST 31 15 - 41 U/L   ALT 28 14 - 54 U/L   Alkaline Phosphatase 80 38 - 126 U/L   Total Bilirubin 0.5 0.3 - 1.2 mg/dL   GFR calc non Af Amer >60 >60 mL/min   GFR calc Af Amer >60 >60 mL/min   Anion gap 10 5 - 15  CBC  Result Value Ref Range  WBC 20.4 (H) 4.0 - 10.5 K/uL   RBC 4.60 3.87 - 5.11 MIL/uL   Hemoglobin 13.7 12.0 - 15.0 g/dL   HCT 42.9 36.0 - 46.0 %   MCV 93.3 78.0 - 100.0 fL   MCH 29.8 26.0 - 34.0 pg   MCHC 31.9 30.0 - 36.0 g/dL   RDW 13.4 11.5 - 15.5 %   Platelets 253 150 - 400 K/uL  Urinalysis, Routine w reflex microscopic (not at [facility])  Result Value Ref Range   Color, Urine YELLOW YELLOW   APPearance CLOUDY (A) CLEAR   Specific Gravity, Urine 1.020 1.005 - 1.030   pH 5.0 5.0 - 8.0   Glucose, UA NEGATIVE NEGATIVE mg/dL   Hgb urine dipstick MODERATE (A) NEGATIVE   Bilirubin Urine NEGATIVE NEGATIVE   Ketones, ur NEGATIVE NEGATIVE mg/dL   Protein, ur NEGATIVE NEGATIVE mg/dL   Nitrite POSITIVE (A) NEGATIVE   Leukocytes, UA MODERATE (A) NEGATIVE  Lactic acid, plasma  Result Value Ref Range   Lactic Acid, Venous 1.9 0.5 - 2.0 mmol/L  Urine microscopic-add on  Result Value Ref Range   Squamous Epithelial / LPF 6-30 (A) NONE SEEN   WBC, UA 6-30 0 - 5 WBC/hpf   RBC / HPF 6-30 0 - 5 RBC/hpf   Bacteria, UA MANY (A) NONE SEEN   Crystals CA OXALATE CRYSTALS (A) NEGATIVE  POC occult blood, ED  Result Value Ref Range   Fecal Occult Bld  POSITIVE (A) NEGATIVE   Ct Abdomen Pelvis W Contrast 03/31/2016  CLINICAL DATA:  Diarrhea since the beginning of the week with lower abdominal pain starting yesterday, bright red blood in stools this morning. History of kidney stones, high BS, diverticulitis and lumbar fracture. EXAM: CT ABDOMEN AND PELVIS WITH CONTRAST TECHNIQUE: Multidetector CT imaging of the abdomen and pelvis was performed using the standard protocol following bolus administration of intravenous contrast. CONTRAST:  29mL OMNIPAQUE IOHEXOL 300 MG/ML  SOLN COMPARISON:  Chest and abdominal x-rays dated 06/28/2008. FINDINGS: Lower chest: Hiatal hernia, moderate in size. Adjacent lung bases are clear. Hepatobiliary: Status post cholecystectomy with associated bile duct ectasia. Liver appears normal, perhaps mild fatty infiltration. Pancreas: No mass, inflammatory changes, or other significant abnormality. Spleen: Within normal limits in size and appearance. Adrenals/Urinary Tract: Adrenal glands appear normal. Multiple renal cysts bilaterally, largest is on the left measuring 7 cm greatest dimension. 2 mm left renal stone. No ureteral or bladder calculi identified. Bladder appears normal, partially decompressed. Stomach/Bowel: There is thickening of the walls of a long segment of the sigmoid and descending colon, involving the mid descending colon to the lower sigmoid colon, suggesting acute colitis of infectious, inflammatory or ischemic nature. Scattered diverticulosis noted within the sigmoid colon. No dilated large or small bowel loops. Appendix is not seen but there are no inflammatory changes about the cecum to suggest acute appendicitis. Vascular/Lymphatic: Prominent atherosclerotic changes of the normal- aorta and pelvic vasculature. No acute-appearing vascular abnormality seen. No enlarged lymph nodes seen. Reproductive: Status post hysterectomy. Other: No free fluid or abscess collection seen. No free intraperitoneal air. Musculoskeletal:  No acute or suspicious osseous lesion. Degenerative change throughout the thoracolumbar spine, moderate in degree, with associated disc bulges at multiple levels of the lumbar spine causing mild-to-moderate degrees of central canal stenosis, most significant stenosis at the L5-S1 level with possible associated nerve root impingement. IMPRESSION: 1. Thickening of the walls of a long segment of the descending and sigmoid colon, compatible with acute colitis of infectious, inflammatory or ischemic nature.  There is associated paracolic inflammation. No abscess collection. No evidence of bowel perforation. No associated bowel obstruction. 2. Colonic diverticulosis. 3. Bilateral renal cysts. Also 2 mm nonobstructing left renal stone. 4. Hiatal hernia, moderate in size. 5. Probable mild fatty infiltration of the liver. 6. Fairly prominent atherosclerotic changes of the abdominal aorta and pelvic vasculature. No acute -appearing vascular abnormality seen. No aortic aneurysm. 7. Degenerative changes of the thoracolumbar spine, as detailed above. Electronically Signed   By: Franki Cabot M.D.   On: 03/31/2016 17:08    1745:  Udip contaminated; UC pending. Will dose IV cipro/flagyl for colitis on CT scan. GI pathogen panel ordered. VS remain stable. Dx and testing d/w pt and family.  Questions answered.  Verb understanding, agreeable to admit.  T/C to [facility] Dr. Nehemiah Settle, case discussed, including:  HPI, pertinent PM/SHx, VS/PE, dx testing, ED course and treatment:  Agreeable to admit, requests to write temporary orders, obtain medical bed to team APAdmits.   Francine Graven, DO 04/01/16 2135

## 2016-03-31 NOTE — Progress Notes (Signed)
Pharmacy Antibiotic Note  Jodi Webb is a 82 y.o. female admitted on 03/31/2016 with abdominal infection.  Pharmacy has been consulted for cipro dosing.  Plan: Cipro 400 mg IV q12 hours F/u renal function, cultures and clinical course  Height: 5\' 1"  (154.9 cm) Weight: 170 lb (77.111 kg) IBW/kg (Calculated) : 47.8  Temp (24hrs), Avg:98.8 F (37.1 C), Min:98.8 F (37.1 C), Max:98.8 F (37.1 C)   Recent Labs Lab 03/31/16 1433  WBC 20.4*  CREATININE 0.67  LATICACIDVEN 1.9    Estimated Creatinine Clearance: 51.8 mL/min (by C-G formula based on Cr of 0.67).    No Known Allergies  Antimicrobials this admission: 4/1 cipro >>  4/1 flagyl >>    Thank you for allowing pharmacy to be a part of this patient's care.  Melo Stauber, [place] 03/31/2016 5:42 PM

## 2016-03-31 NOTE — ED Notes (Signed)
Completed oral contrast. 

## 2016-04-01 ENCOUNTER — Encounter (HOSPITAL_COMMUNITY): Payer: Self-pay | Admitting: *Deleted

## 2016-04-01 DIAGNOSIS — K921 Melena: Secondary | ICD-10-CM | POA: Diagnosis present

## 2016-04-01 DIAGNOSIS — E119 Type 2 diabetes mellitus without complications: Secondary | ICD-10-CM

## 2016-04-01 DIAGNOSIS — I10 Essential (primary) hypertension: Secondary | ICD-10-CM

## 2016-04-01 DIAGNOSIS — A09 Infectious gastroenteritis and colitis, unspecified: Secondary | ICD-10-CM | POA: Diagnosis not present

## 2016-04-01 DIAGNOSIS — D62 Acute posthemorrhagic anemia: Secondary | ICD-10-CM | POA: Diagnosis not present

## 2016-04-01 DIAGNOSIS — N390 Urinary tract infection, site not specified: Secondary | ICD-10-CM | POA: Diagnosis not present

## 2016-04-01 DIAGNOSIS — K5730 Diverticulosis of large intestine without perforation or abscess without bleeding: Secondary | ICD-10-CM | POA: Diagnosis present

## 2016-04-01 DIAGNOSIS — K529 Noninfective gastroenteritis and colitis, unspecified: Secondary | ICD-10-CM | POA: Diagnosis not present

## 2016-04-01 LAB — BASIC METABOLIC PANEL
Anion gap: 7 (ref 5–15)
BUN: 14 mg/dL (ref 6–20)
CALCIUM: 8.3 mg/dL — AB (ref 8.9–10.3)
CO2: 26 mmol/L (ref 22–32)
CREATININE: 0.55 mg/dL (ref 0.44–1.00)
Chloride: 106 mmol/L (ref 101–111)
GFR calc non Af Amer: >60 mL/min (ref >60)
Glucose, Bld: 179 mg/dL — ABNORMAL HIGH (ref 65–99)
Potassium: 4 mmol/L (ref 3.5–5.1)
Sodium: 139 mmol/L (ref 135–145)

## 2016-04-01 LAB — GLUCOSE, CAPILLARY
GLUCOSE-CAPILLARY: 163 mg/dL — AB (ref 65–99)
Glucose-Capillary: 67 mg/dL (ref 65–99)

## 2016-04-01 LAB — CBC
HCT: 36 % (ref 36.0–46.0)
Hemoglobin: 11.4 g/dL — ABNORMAL LOW (ref 12.0–15.0)
MCH: 30.2 pg (ref 26.0–34.0)
MCHC: 31.7 g/dL (ref 30.0–36.0)
MCV: 95.5 fL (ref 78.0–100.0)
PLATELETS: 216 10*3/uL (ref 150–400)
RBC: 3.77 MIL/uL — AB (ref 3.87–5.11)
RDW: 13.5 % (ref 11.5–15.5)
WBC: 12.4 10*3/uL — ABNORMAL HIGH (ref 4.0–10.5)

## 2016-04-01 MED ORDER — INSULIN ASPART 100 UNIT/ML ~~LOC~~ SOLN: 0.0000 [IU] | Freq: Every day | SUBCUTANEOUS | Status: DC

## 2016-04-01 MED ORDER — INSULIN ASPART 100 UNIT/ML ~~LOC~~ SOLN
0.0000 [IU] | Freq: Three times a day (TID) | SUBCUTANEOUS | Status: DC
  Administered 2016-04-01: 2 [IU] via SUBCUTANEOUS
  Administered 2016-04-02 – 2016-04-03 (×2): 1 [IU] via SUBCUTANEOUS
  Administered 2016-04-03: 2 [IU] via SUBCUTANEOUS
  Administered 2016-04-04: 1 [IU] via SUBCUTANEOUS

## 2016-04-01 MED ORDER — POTASSIUM CHLORIDE IN NACL 20-0.9 MEQ/L-% IV SOLN
INTRAVENOUS | Status: DC
  Administered 2016-04-01 – 2016-04-02 (×2): via INTRAVENOUS

## 2016-04-01 NOTE — Progress Notes (Signed)
[facility] PROGRESS NOTE  DANICKA VANLUE [id number] DOB: 12/25/1932 DOA: 03/31/2016 PCP: [name], DO    Code Status: full code  Family Communication: discussed with daughter Disposition Plan: discharge when clinically appropriate, likely in a few days   Consultants:   gastroenterology  Procedures:   none  Antibiotics:   Cipro 03/31/16>>  Flagyl 03/31/16>>  HPI/Subjective:  Patient has some lower abdominal pain. Pain medications helping. She has not had a bowel movement since admission.   Objective: Filed Vitals:   03/31/16 1939 04/01/16 0718  BP: 112/52 131/56  Pulse: 60 78  Temp: 99.1 F (37.3 C) 99.1 F (37.3 C)  Resp: 16 16   Oxygen saturation 98% on room air.  Intake/Output Summary (Last 24 hours) at 04/01/16 1237 Last data filed at 04/01/16 1059  Gross per 24 hour  Intake    320 ml  Output      0 ml  Net    320 ml   Filed Weights   03/31/16 1358 03/31/16 1939  Weight: 77.111 kg (170 lb) 80 kg (176 lb 5.9 oz)    Exam:   General:   Pleasant 82 year old woman laying in bed, in no acute distress.  Cardiovascular:  S1, S2, no murmurs rubs or gallops.  Respiratory:  Decreased breath sounds at the bases and clear anteriorly. Breathing nonlabored.  Abdomen:  Positive bowel sounds, soft, mildly to moderately tender left greater than right lower quadrant; no distention or masses palpated.  Musculoskeletal/Extremities: No pedal edema.  Neurologic: She is alert and oriented 2. Cranial nerves II through XII are grossly intact.   Data Reviewed: Basic Metabolic Panel:  Recent Labs Lab 03/31/16 1433 04/01/16 0602  NA 140 139  K 4.3 4.0  CL 104 106  CO2 26 26  GLUCOSE 151* 179*  BUN 21* 14  CREATININE 0.67 0.55  CALCIUM 9.2 8.3*   Liver Function Tests:  Recent Labs Lab 03/31/16 1433  AST 31  ALT 28  ALKPHOS 80  BILITOT 0.5  PROT 7.8  ALBUMIN 4.2    Recent Labs Lab 03/31/16 1433  LIPASE 25   No results for input(s):  AMMONIA in the last 168 hours. CBC:  Recent Labs Lab 03/31/16 1433 04/01/16 0602  WBC 20.4* 12.4*  HGB 13.7 11.4*  HCT 42.9 36.0  MCV 93.3 95.5  PLT 253 216   Cardiac Enzymes: No results for input(s): CKTOTAL, CKMB, CKMBINDEX, TROPONINI in the last 168 hours. BNP (last 3 results) No results for input(s): BNP in the last 8760 hours.  ProBNP (last 3 results) No results for input(s): PROBNP in the last 8760 hours.  CBG: No results for input(s): GLUCAP in the last 168 hours.  No results found for this or any previous visit (from the past 240 hour(s)).   Studies: Ct Abdomen Pelvis W Contrast  03/31/2016  CLINICAL DATA:  Diarrhea since the beginning of the week with lower abdominal pain starting yesterday, bright red blood in stools this morning. History of kidney stones, high BS, diverticulitis and lumbar fracture. EXAM: CT ABDOMEN AND PELVIS WITH CONTRAST TECHNIQUE: Multidetector CT imaging of the abdomen and pelvis was performed using the standard protocol following bolus administration of intravenous contrast. CONTRAST:  57mL OMNIPAQUE IOHEXOL 300 MG/ML  SOLN COMPARISON:  Chest and abdominal x-rays dated 06/28/2008. FINDINGS: Lower chest: Hiatal hernia, moderate in size. Adjacent lung bases are clear. Hepatobiliary: Status post cholecystectomy with associated bile duct ectasia. Liver appears normal, perhaps mild fatty infiltration. Pancreas: No mass, inflammatory changes, or other  significant abnormality. Spleen: Within normal limits in size and appearance. Adrenals/Urinary Tract: Adrenal glands appear normal. Multiple renal cysts bilaterally, largest is on the left measuring 7 cm greatest dimension. 2 mm left renal stone. No ureteral or bladder calculi identified. Bladder appears normal, partially decompressed. Stomach/Bowel: There is thickening of the walls of a long segment of the sigmoid and descending colon, involving the mid descending colon to the lower sigmoid colon, suggesting acute  colitis of infectious, inflammatory or ischemic nature. Scattered diverticulosis noted within the sigmoid colon. No dilated large or small bowel loops. Appendix is not seen but there are no inflammatory changes about the cecum to suggest acute appendicitis. Vascular/Lymphatic: Prominent atherosclerotic changes of the normal- aorta and pelvic vasculature. No acute-appearing vascular abnormality seen. No enlarged lymph nodes seen. Reproductive: Status post hysterectomy. Other: No free fluid or abscess collection seen. No free intraperitoneal air. Musculoskeletal: No acute or suspicious osseous lesion. Degenerative change throughout the thoracolumbar spine, moderate in degree, with associated disc bulges at multiple levels of the lumbar spine causing mild-to-moderate degrees of central canal stenosis, most significant stenosis at the L5-S1 level with possible associated nerve root impingement. IMPRESSION: 1. Thickening of the walls of a long segment of the descending and sigmoid colon, compatible with acute colitis of infectious, inflammatory or ischemic nature. There is associated paracolic inflammation. No abscess collection. No evidence of bowel perforation. No associated bowel obstruction. 2. Colonic diverticulosis. 3. Bilateral renal cysts. Also 2 mm nonobstructing left renal stone. 4. Hiatal hernia, moderate in size. 5. Probable mild fatty infiltration of the liver. 6. Fairly prominent atherosclerotic changes of the abdominal aorta and pelvic vasculature. No acute -appearing vascular abnormality seen. No aortic aneurysm. 7. Degenerative changes of the thoracolumbar spine, as detailed above. Electronically Signed   By: Franki Cabot M.D.   On: 03/31/2016 17:08    Scheduled Meds: . ALPRAZolam  1 mg Oral BID  . atorvastatin  10 mg Oral q morning - 10a  . ciprofloxacin  400 mg Intravenous Q12H  . diltiazem  240 mg Oral Daily  . DULoxetine  60 mg Oral q morning - 10a  . lisinopril  5 mg Oral q morning - 10a   . metronidazole  500 mg Intravenous Q8H  . pantoprazole  40 mg Oral q morning - 10a   Continuous Infusions: . sodium chloride      Assessment and plan:  Principal Problem:   Colitis, infectious Active Problems:   Hematochezia   Diverticulosis of colon   Chronic UTI   Essential hypertension   GERD (gastroesophageal reflux disease)   Acute blood loss anemia   Type 2 diabetes mellitus without complication ([place])     1. Acute colitis, presumed to be infectious.  On admission, CT scan of patient's abdomen and pelvis revealed thickening of the walls of a long segment of the descending and sigmoid colon, compatible with colitis; No evidence of abscess obstruction, or perforation; Colonic diverticulosis. -Patient was made to be nothing by mouth. She was started on Cipro and Flagyl. Supportive treatment was given with as needed analgesics. Maintenance IV fluids were started. -Patient has not had further bowel movements so far. -Gastroenterology was consulted. Per my conversation with Dr. Laural Golden, clear liquids can be started. He has ordered a GI pathogen panel and C. Difficile PCR for additional workup.   Hematochezia/lower GI bleed, presumed to be secondary to colitis; Acute blood loss anemia.  Patient reported maroon colored rectal bleeding at home.  Her hemoglobin was 13.7 on admission.  It has fallen with equilibration and IV fluids. -We'll continue to monitor her H&H. No requirement for blood transfusion.   Chronic urinary tract infections.  Patient's history is positive for chronic UTIs. She takes Macrodantin when necessary.  On admission, her UA was positive for nitrite, many bacteria, and 6-30 WBCs. -Urine culture pending. -Continue antibiotics above.   Type 2 diabetes mellitus.  Patient was recently diagnosed with diabetes mellitus. She had been taking metformin, but ran out of the sample doses. She did not like taking metformin because it caused some nausea. -We will start  sliding scale NovoLog. Will order hemoglobin A1c.   Hypertension.  Patient is treated chronically with diltiazem, HCTZ, and lisinopril. She was continued on diltiazem and lisinopril, but HCTZ is being held. Her blood pressure stable.    Time spent: 35 minutes    [facility] Pager  [phone]. If 7PM-7AM, please contact night-coverage at [email or web address], password [facility] 04/01/2016, 12:37 PM

## 2016-04-01 NOTE — Consult Note (Signed)
Referring Provider: Rexene Alberts, MD Primary Care Physician:  [name], DO Primary Gastroenterologist:  Dr. Laural Golden  Reason for Consultation:    Diarrhea and rectal bleeding.  HPI:   Patient is 82 year old Caucasian female was history of IBS with postprandial diarrhea and last colonoscopy was in October 2009 as reviewed under past medical history was in usual state of health until one week ago when she began to have diarrhea both during day and nighttime. She also experience lower abdominal cramping but no fever or chills. Yesterday morning she noted blood with her bowel movements. Was small in amount. She also had nausea and vomiting. Patient came to emergency room for evaluation last evening. Abdominopelvic CT was obtained which revealed thickening to descending and sigmoid colon with pericolonic inflammatory changes. Patient was hospitalized and begun on IV Cipro and metronidazole. She is still having diarrhea but feels better. She denies nausea or vomiting. She is hungry. She has history of recurrent urinary tract infection requiring antibiotics. She is treated with macro dentine last course was one month ago.  Family history significant for anesthetic carcinoma in mother who died within 25 weeks of age 58. Primary could not be determined. Father died of presumed alcoholic cirrhosis at age 20. She has a sister who was treated for breast carcinoma in her 63s and now has dementia and 44. Another sister died of lung cancer but she was treated for colon carcinoma in her 49s. She was 77 at the time of diagnosis. She has 2 sons and 2 daughters living. One of her daughters who is at bedside as UC and is doing well. One son died at age 98 of 23 carcinoma from an unknown primary. Patient does not smoke cigarettes or drink alcohol.            Past Medical History  Diagnosis Date  . Hyperlipemia   . GERD (gastroesophageal reflux disease)   . IBS (irritable bowel syndrome)    She had  colonoscopy in October 2009 revealing mild left-sided colonic diverticulosis.    . Diverticulosis   . Diabetes mellitus without complication ([place])   . Hypertension   . Hospitalized for urosepsis in 2008 because of obstructive uropathy due to stones.             Depression.        Past Surgical History  Procedure Laterality Date  . Back surgery in 1984 disc disease     . Cholecystectomy in 2004.     . Abdominal hysterectomy about 40 years ago.     . Kidney stents         Lateral lateral cataract extraction.      Left knee arthroscopy in 2012   Prior to Admission medications   Medication Sig Start Date End Date Taking? Authorizing Provider  acidophilus (RISAQUAD) CAPS capsule Take 1 capsule by mouth daily.   Yes Historical Provider, MD  ALPRAZolam Duanne Moron) 1 MG tablet Take 1 mg by mouth 2 (two) times daily.   Yes Historical Provider, MD  aspirin EC 81 MG tablet Take 81 mg by mouth every morning.   Yes Historical Provider, MD  atorvastatin (LIPITOR) 10 MG tablet Take 10 mg by mouth every morning.   Yes Historical Provider, MD  cholecalciferol (VITAMIN D) 1000 UNITS tablet Take 5,000 Units by mouth daily.   Yes Historical Provider, MD  Cyanocobalamin (VITAMIN B-12 PO) Take 1 tablet by mouth daily.   Yes Historical Provider, MD  diclofenac (VOLTAREN) 75 MG EC tablet Take 75 mg  by mouth 2 (two) times daily.   Yes Historical Provider, MD  diltiazem (DILACOR XR) 240 MG 24 hr capsule Take 240 mg by mouth daily.   Yes Historical Provider, MD  DULoxetine (CYMBALTA) 60 MG capsule Take 60 mg by mouth every morning.    Yes Historical Provider, MD  hydrochlorothiazide (MICROZIDE) 12.5 MG capsule Take 12.5 mg by mouth daily.   Yes Historical Provider, MD  lisinopril (PRINIVIL,ZESTRIL) 5 MG tablet Take 5 mg by mouth every morning.   Yes Historical Provider, MD  Multiple Vitamins-Minerals (MULTIVITAMINS THER. W/MINERALS) TABS tablet Take 1 tablet by mouth daily.   Yes Historical Provider, MD  Omega-3  Fatty Acids (FISH OIL PO) Take 1 capsule by mouth daily.   Yes Historical Provider, MD  pantoprazole (PROTONIX) 40 MG tablet Take 40 mg by mouth every morning.   Yes Historical Provider, MD  promethazine (PHENERGAN) 25 MG tablet Take 25 mg by mouth every 6 (six) hours as needed for nausea or vomiting.   Yes Historical Provider, MD    Current Facility-Administered Medications  Medication Dose Route Frequency Provider Last Rate Last Dose  . 0.9 %  sodium chloride infusion   Intravenous Continuous Tanna Savoy Stinson, DO      . acetaminophen (TYLENOL) tablet 650 mg  650 mg Oral Q6H PRN Tanna Savoy Stinson, DO   650 mg at 04/01/16 0505   Or  . acetaminophen (TYLENOL) suppository 650 mg  650 mg Rectal Q6H PRN Tanna Savoy Stinson, DO      . ALPRAZolam Duanne Moron) tablet 1 mg  1 mg Oral BID Tanna Savoy Stinson, DO   1 mg at 04/01/16 1100  . atorvastatin (LIPITOR) tablet 10 mg  10 mg Oral q morning - 10a Tanna Savoy Stinson, DO   10 mg at 04/01/16 1100  . ciprofloxacin (CIPRO) IVPB 400 mg  400 mg Intravenous Q12H Francine Graven, DO 200 mL/hr at 04/01/16 0545 400 mg at 04/01/16 0545  . diltiazem (DILACOR XR) 24 hr capsule 240 mg  240 mg Oral Daily Tanna Savoy Stinson, DO   240 mg at 04/01/16 1100  . DULoxetine (CYMBALTA) DR capsule 60 mg  60 mg Oral q morning - 10a Tanna Savoy Stinson, DO   60 mg at 04/01/16 1100  . lisinopril (PRINIVIL,ZESTRIL) tablet 5 mg  5 mg Oral q morning - 10a Tanna Savoy Stinson, DO   5 mg at 04/01/16 1100  . metroNIDAZOLE (FLAGYL) IVPB 500 mg  500 mg Intravenous Q8H Tanna Savoy Stinson, DO   500 mg at 04/01/16 0801  . ondansetron (ZOFRAN) injection 4 mg  4 mg Intravenous Q1H PRN Francine Graven, DO   4 mg at 03/31/16 1606  . pantoprazole (PROTONIX) EC tablet 40 mg  40 mg Oral q morning - 10a Tanna Savoy Stinson, DO   40 mg at 04/01/16 1100  . promethazine (PHENERGAN) tablet 25 mg  25 mg Oral Q6H PRN Truett Mainland, DO        Allergies as of 03/31/2016  . (No Known Allergies)    History reviewed. No pertinent  family history.  Social History   Social History  . Marital Status: Widowed    Spouse Name: N/A  . Number of Children: N/A  . Years of Education: N/A   Occupational History  . Not on file.   Social History Main Topics  . Smoking status: Former Research scientist (life sciences)  . Smokeless tobacco: Not on file  . Alcohol Use: No  . Drug Use: No  . Sexual  Activity: Not on file   Other Topics Concern  . Not on file   Social History Narrative    Review of Systems: See HPI, otherwise normal ROS  Physical Exam: Temp:  [98.8 F (37.1 C)-99.1 F (37.3 C)] 99.1 F (37.3 C) (04/02 0718) Pulse Rate:  [60-100] 78 (04/02 0718) Resp:  [16] 16 (04/02 0718) BP: (112-158)/(52-79) 131/56 mmHg (04/02 0718) SpO2:  [95 %-98 %] 98 % (04/02 0718) Weight:  [170 lb (77.111 kg)-176 lb 5.9 oz (80 kg)] 176 lb 5.9 oz (80 kg) (04/01 1939) Last BM Date: 03/30/16 Patient is alert and in no acute distress. Conjunctiva is pink.. Sclerae nonicteric. Oropharyngeal mucosa is normal. No neck masses or thyromegaly noted. Cardiac exam with regular rhythm normal S1 and S2. No murmur or gallop noted. Lungs are clear to auscultation. Abdomen is symmetrical. Bowel sounds are hyperactive. On palpation abdomen is soft with mild tenderness across lower half of the abdomen without guarding or rebound. No organomegaly or masses. No peripheral edema or clubbing noted.   Lab Results:  Recent Labs  03/31/16 1433 04/01/16 0602  WBC 20.4* 12.4*  HGB 13.7 11.4*  HCT 42.9 36.0  PLT 253 216   BMET  Recent Labs  03/31/16 1433 04/01/16 0602  NA 140 139  K 4.3 4.0  CL 104 106  CO2 26 26  GLUCOSE 151* 179*  BUN 21* 14  CREATININE 0.67 0.55  CALCIUM 9.2 8.3*   LFT  Recent Labs  03/31/16 1433  PROT 7.8  ALBUMIN 4.2  AST 31  ALT 28  ALKPHOS 80  BILITOT 0.5   PT/INR No results for input(s): LABPROT, INR in the last 72 hours. Hepatitis Panel No results for input(s): HEPBSAG, HCVAB, HEPAIGM, HEPBIGM in the last 72  hours.  Studies/Results: Ct Abdomen Pelvis W Contrast  03/31/2016  CLINICAL DATA:  Diarrhea since the beginning of the week with lower abdominal pain starting yesterday, bright red blood in stools this morning. History of kidney stones, high BS, diverticulitis and lumbar fracture. EXAM: CT ABDOMEN AND PELVIS WITH CONTRAST TECHNIQUE: Multidetector CT imaging of the abdomen and pelvis was performed using the standard protocol following bolus administration of intravenous contrast. CONTRAST:  59mL OMNIPAQUE IOHEXOL 300 MG/ML  SOLN COMPARISON:  Chest and abdominal x-rays dated 06/28/2008. FINDINGS: Lower chest: Hiatal hernia, moderate in size. Adjacent lung bases are clear. Hepatobiliary: Status post cholecystectomy with associated bile duct ectasia. Liver appears normal, perhaps mild fatty infiltration. Pancreas: No mass, inflammatory changes, or other significant abnormality. Spleen: Within normal limits in size and appearance. Adrenals/Urinary Tract: Adrenal glands appear normal. Multiple renal cysts bilaterally, largest is on the left measuring 7 cm greatest dimension. 2 mm left renal stone. No ureteral or bladder calculi identified. Bladder appears normal, partially decompressed. Stomach/Bowel: There is thickening of the walls of a long segment of the sigmoid and descending colon, involving the mid descending colon to the lower sigmoid colon, suggesting acute colitis of infectious, inflammatory or ischemic nature. Scattered diverticulosis noted within the sigmoid colon. No dilated large or small bowel loops. Appendix is not seen but there are no inflammatory changes about the cecum to suggest acute appendicitis. Vascular/Lymphatic: Prominent atherosclerotic changes of the normal- aorta and pelvic vasculature. No acute-appearing vascular abnormality seen. No enlarged lymph nodes seen. Reproductive: Status post hysterectomy. Other: No free fluid or abscess collection seen. No free intraperitoneal air.  Musculoskeletal: No acute or suspicious osseous lesion. Degenerative change throughout the thoracolumbar spine, moderate in degree, with associated disc bulges at  multiple levels of the lumbar spine causing mild-to-moderate degrees of central canal stenosis, most significant stenosis at the L5-S1 level with possible associated nerve root impingement. IMPRESSION: 1. Thickening of the walls of a long segment of the descending and sigmoid colon, compatible with acute colitis of infectious, inflammatory or ischemic nature. There is associated paracolic inflammation. No abscess collection. No evidence of bowel perforation. No associated bowel obstruction. 2. Colonic diverticulosis. 3. Bilateral renal cysts. Also 2 mm nonobstructing left renal stone. 4. Hiatal hernia, moderate in size. 5. Probable mild fatty infiltration of the liver. 6. Fairly prominent atherosclerotic changes of the abdominal aorta and pelvic vasculature. No acute -appearing vascular abnormality seen. No aortic aneurysm. 7. Degenerative changes of the thoracolumbar spine, as detailed above. Electronically Signed   By: Franki Cabot M.D.   On: 03/31/2016 17:08   CT films reviewed.  Assessment;  Patient is 82 year old Caucasian female who presents with one week history of diarrhea and lower abdominal cramping and she noted bloody stools yesterday morning. She does not appear to be toxic. She has history of recurrent urinary tract infection requiring therapy with Macrobid. I do not believe she has C. difficile colitis. Most likely etiology is infectious colitis and less likely ischemic injury. She has inflammatory changes involving descending and sigmoid colon. This distribution would be unusual with ischemic colitis. Similarly UC would be an unlikely diagnosis. Larene Beach is on empiric therapy with Cipro and metronidazole.  Recommendations;  Patient begun on clear liquids. Stool test for C. Difficile. GI pathogen panel. Consider flexible  sigmoidoscopy if symptoms get worse stool studies are negative.     REHMAN,NAJEEB U  04/01/2016, 11:11 AM

## 2016-04-02 ENCOUNTER — Observation Stay (HOSPITAL_COMMUNITY): Payer: Medicare Other

## 2016-04-02 DIAGNOSIS — N390 Urinary tract infection, site not specified: Secondary | ICD-10-CM | POA: Diagnosis not present

## 2016-04-02 DIAGNOSIS — A09 Infectious gastroenteritis and colitis, unspecified: Secondary | ICD-10-CM | POA: Diagnosis not present

## 2016-04-02 DIAGNOSIS — K921 Melena: Secondary | ICD-10-CM | POA: Diagnosis not present

## 2016-04-02 DIAGNOSIS — K529 Noninfective gastroenteritis and colitis, unspecified: Secondary | ICD-10-CM | POA: Diagnosis not present

## 2016-04-02 DIAGNOSIS — J9691 Respiratory failure, unspecified with hypoxia: Secondary | ICD-10-CM | POA: Diagnosis present

## 2016-04-02 DIAGNOSIS — J9601 Acute respiratory failure with hypoxia: Secondary | ICD-10-CM

## 2016-04-02 DIAGNOSIS — D62 Acute posthemorrhagic anemia: Secondary | ICD-10-CM | POA: Diagnosis not present

## 2016-04-02 DIAGNOSIS — J9611 Chronic respiratory failure with hypoxia: Secondary | ICD-10-CM | POA: Diagnosis present

## 2016-04-02 DIAGNOSIS — I10 Essential (primary) hypertension: Secondary | ICD-10-CM | POA: Diagnosis not present

## 2016-04-02 LAB — GLUCOSE, CAPILLARY
GLUCOSE-CAPILLARY: 76 mg/dL (ref 65–99)
GLUCOSE-CAPILLARY: 103 mg/dL — AB (ref 65–99)
Glucose-Capillary: 108 mg/dL — ABNORMAL HIGH (ref 65–99)
Glucose-Capillary: 133 mg/dL — ABNORMAL HIGH (ref 65–99)
Glucose-Capillary: 109 mg/dL — ABNORMAL HIGH (ref 65–99)

## 2016-04-02 LAB — CBC
HCT: 41.4 % (ref 36.0–46.0)
Hemoglobin: 12.8 g/dL (ref 12.0–15.0)
MCH: 29.8 pg (ref 26.0–34.0)
MCHC: 30.9 g/dL (ref 30.0–36.0)
MCV: 96.5 fL (ref 78.0–100.0)
PLATELETS: 257 10*3/uL (ref 150–400)
RBC: 4.29 MIL/uL (ref 3.87–5.11)
RDW: 13.6 % (ref 11.5–15.5)
WBC: 12.6 10*3/uL — AB (ref 4.0–10.5)

## 2016-04-02 LAB — BASIC METABOLIC PANEL
Anion gap: 10 (ref 5–15)
BUN: 7 mg/dL (ref 6–20)
CALCIUM: 9 mg/dL (ref 8.9–10.3)
CO2: 27 mmol/L (ref 22–32)
CREATININE: 0.54 mg/dL (ref 0.44–1.00)
Chloride: 104 mmol/L (ref 101–111)
GFR calc non Af Amer: >60 mL/min (ref >60)
Glucose, Bld: 160 mg/dL — ABNORMAL HIGH (ref 65–99)
Potassium: 4.2 mmol/L (ref 3.5–5.1)
SODIUM: 141 mmol/L (ref 135–145)

## 2016-04-02 LAB — C DIFFICILE QUICK SCREEN W PCR REFLEX
C DIFFICILE (CDIFF) TOXIN: NEGATIVE
C DIFFICLE (CDIFF) ANTIGEN: NEGATIVE
C Diff interpretation: NEGATIVE

## 2016-04-02 MED ORDER — HYDROMORPHONE HCL 1 MG/ML IJ SOLN: 0.2500 mg | INTRAMUSCULAR | Status: DC | PRN

## 2016-04-02 NOTE — Care Management Note (Signed)
Case Management Note  Patient Details  Name: Jodi Webb MRN: [id number] Date of Birth: 1932/12/05  Subjective/Objective:                  Pt admitted with collitis. Pt is from home, lives alone and has strong support from family. Pt's family has arranged for someone to come in and do housework 2 days per week. Pt is ind with ADL's, uses a cane/walker as needed. Pt has PCP and drivers herself to appointments. Pt has no difficulty obtaining medications PTA. Pt plans to return home with self care at DC.   Action/Plan: No CM needs anticipated. Will cont to follow.   Expected Discharge Date:     04/03/2016             Expected Discharge Plan:  Home/Self Care  In-House Referral:  NA  Discharge planning Services  CM Consult  Post Acute Care Choice:  NA Choice offered to:  NA  DME Arranged:    DME Agency:     HH Arranged:    HH Agency:     Status of Service:  In process, will continue to follow  Medicare Important Message Given:  Yes Date Medicare IM Given:    Medicare IM give by:    Date Additional Medicare IM Given:    Additional Medicare Important Message give by:     If discussed at [place] of Stay Meetings, dates discussed:    Additional Comments:  Sherald Barge, RN 04/02/2016, 10:56 AM

## 2016-04-02 NOTE — Progress Notes (Signed)
  Subjective:  Patient states she had one small bowel movement yesterday but stool sample was not large enough for stool studies. She denies rectal bleeding. She continues to complain of lower abdominal pain. She says pain is no better or worse. She states she is getting little relief with Tylenol. She does not have an appetite but denies nausea or vomiting.   Objective: Blood pressure 137/59, pulse 76, temperature 98 F (36.7 C), temperature source Oral, resp. rate 20, height 5\' 1"  (1.549 m), weight 176 lb 5.9 oz (80 kg), SpO2 97 %. Patient is alert and appears to be comfortable. Abdomen is symmetrical with mild tenderness across lower half of the abdomen and LUQ. No organomegaly or masses. No LE edema or clubbing noted.  Labs/studies Results:   Recent Labs  03/31/16 1433 04/01/16 0602 04/02/16 0600  WBC 20.4* 12.4* 12.6*  HGB 13.7 11.4* 12.8  HCT 42.9 36.0 41.4  PLT 253 216 257    BMET   Recent Labs  03/31/16 1433 04/01/16 0602 04/02/16 0600  NA 140 139 141  K 4.3 4.0 4.2  CL 104 106 104  CO2 26 26 27   GLUCOSE 151* 179* 160*  BUN 21* 14 7  CREATININE 0.67 0.55 0.54  CALCIUM 9.2 8.3* 9.0    LFT   Recent Labs  03/31/16 1433  PROT 7.8  ALBUMIN 4.2  AST 31  ALT 28  ALKPHOS 80  BILITOT 0.5    Stool studies are pending. Patient has not provided stool specimen yet.  Assessment:  #1. Acute colitis most likely secondary to an infection.  Doubt other etiologies. It is worth mentioning that her daughter has ulcerative colitis. She remains with lower abdominal pain and mild leukocytosis.  Recommendations:  Advance diet to full liquids. Hydromorphone. 0.25 mg IV every 4 when necessary pain. CBC with differential in a.m.

## 2016-04-02 NOTE — Care Management Important Message (Signed)
Important Message  Patient Details  Name: Jodi Webb MRN: [id number] Date of Birth: [birth year]   Medicare Important Message Given:  Yes    Sherald Barge, RN 04/02/2016, 10:56 AM

## 2016-04-02 NOTE — Progress Notes (Signed)
[facility] PROGRESS NOTE  Jodi Webb [id number] DOB: 01/14/1932 DOA: 03/31/2016 PCP: [name], DO    Code Status: full code  Family Communication: discussed with daughter Disposition Plan: discharge when clinically appropriate, likely in a few days   Consultants:   Gastroenterology  Procedures:   None  Antibiotics:   Cipro 03/31/16>>  Flagyl 03/31/16>>  HPI/Subjective:  Patient still has some lower abdominal pain, but the pain medication is helping. She has not had a bowel movement since admission. She is tolerating clear liquids.  Objective: Filed Vitals:   04/02/16 0400 04/02/16 1300  BP: 137/59 121/48  Pulse: 76 64  Temp: 98 F (36.7 C) 97.8 F (36.6 C)  Resp:  16   Oxygen saturation 98% on Oxygen and 86% on room air.  Intake/Output Summary (Last 24 hours) at 04/02/16 1506 Last data filed at 04/02/16 1100  Gross per 24 hour  Intake   1715 ml  Output      3 ml  Net   1712 ml   Filed Weights   03/31/16 1358 03/31/16 1939  Weight: 77.111 kg (170 lb) 80 kg (176 lb 5.9 oz)    Exam:   General:   Pleasant 82 year old woman laying in bed, in no acute distress.  Cardiovascular:  S1, S2, no murmurs rubs or gallops.  Respiratory:  Decreased breath sounds at the bases and clear anteriorly. Breathing nonlabored.  Abdomen:  Positive bowel sounds, soft, mildly to moderately tender left greater than right lower quadrant; no distention or masses palpated.  Musculoskeletal/Extremities: No pedal edema.  Neurologic: She is alert and oriented 2. Cranial nerves II through XII are grossly intact.   Data Reviewed: Basic Metabolic Panel:  Recent Labs Lab 03/31/16 1433 04/01/16 0602 04/02/16 0600  NA 140 139 141  K 4.3 4.0 4.2  CL 104 106 104  CO2 26 26 27   GLUCOSE 151* 179* 160*  BUN 21* 14 7  CREATININE 0.67 0.55 0.54  CALCIUM 9.2 8.3* 9.0   Liver Function Tests:  Recent Labs Lab 03/31/16 1433  AST 31  ALT 28  ALKPHOS 80  BILITOT  0.5  PROT 7.8  ALBUMIN 4.2    Recent Labs Lab 03/31/16 1433  LIPASE 25   No results for input(s): AMMONIA in the last 168 hours. CBC:  Recent Labs Lab 03/31/16 1433 04/01/16 0602 04/02/16 0600  WBC 20.4* 12.4* 12.6*  HGB 13.7 11.4* 12.8  HCT 42.9 36.0 41.4  MCV 93.3 95.5 96.5  PLT 253 216 257   Cardiac Enzymes: No results for input(s): CKTOTAL, CKMB, CKMBINDEX, TROPONINI in the last 168 hours. BNP (last 3 results) No results for input(s): BNP in the last 8760 hours.  ProBNP (last 3 results) No results for input(s): PROBNP in the last 8760 hours.  CBG:  Recent Labs Lab 04/01/16 1735 04/01/16 2049 04/02/16 0126 04/02/16 0712 04/02/16 1059  GLUCAP 163* 67 108* 133* 109*    No results found for this or any previous visit (from the past 240 hour(s)).   Studies: Ct Abdomen Pelvis W Contrast  03/31/2016  CLINICAL DATA:  Diarrhea since the beginning of the week with lower abdominal pain starting yesterday, bright red blood in stools this morning. History of kidney stones, high BS, diverticulitis and lumbar fracture. EXAM: CT ABDOMEN AND PELVIS WITH CONTRAST TECHNIQUE: Multidetector CT imaging of the abdomen and pelvis was performed using the standard protocol following bolus administration of intravenous contrast. CONTRAST:  46mL OMNIPAQUE IOHEXOL 300 MG/ML  SOLN COMPARISON:  Chest and  abdominal x-rays dated 06/28/2008. FINDINGS: Lower chest: Hiatal hernia, moderate in size. Adjacent lung bases are clear. Hepatobiliary: Status post cholecystectomy with associated bile duct ectasia. Liver appears normal, perhaps mild fatty infiltration. Pancreas: No mass, inflammatory changes, or other significant abnormality. Spleen: Within normal limits in size and appearance. Adrenals/Urinary Tract: Adrenal glands appear normal. Multiple renal cysts bilaterally, largest is on the left measuring 7 cm greatest dimension. 2 mm left renal stone. No ureteral or bladder calculi identified. Bladder  appears normal, partially decompressed. Stomach/Bowel: There is thickening of the walls of a long segment of the sigmoid and descending colon, involving the mid descending colon to the lower sigmoid colon, suggesting acute colitis of infectious, inflammatory or ischemic nature. Scattered diverticulosis noted within the sigmoid colon. No dilated large or small bowel loops. Appendix is not seen but there are no inflammatory changes about the cecum to suggest acute appendicitis. Vascular/Lymphatic: Prominent atherosclerotic changes of the normal- aorta and pelvic vasculature. No acute-appearing vascular abnormality seen. No enlarged lymph nodes seen. Reproductive: Status post hysterectomy. Other: No free fluid or abscess collection seen. No free intraperitoneal air. Musculoskeletal: No acute or suspicious osseous lesion. Degenerative change throughout the thoracolumbar spine, moderate in degree, with associated disc bulges at multiple levels of the lumbar spine causing mild-to-moderate degrees of central canal stenosis, most significant stenosis at the L5-S1 level with possible associated nerve root impingement. IMPRESSION: 1. Thickening of the walls of a long segment of the descending and sigmoid colon, compatible with acute colitis of infectious, inflammatory or ischemic nature. There is associated paracolic inflammation. No abscess collection. No evidence of bowel perforation. No associated bowel obstruction. 2. Colonic diverticulosis. 3. Bilateral renal cysts. Also 2 mm nonobstructing left renal stone. 4. Hiatal hernia, moderate in size. 5. Probable mild fatty infiltration of the liver. 6. Fairly prominent atherosclerotic changes of the abdominal aorta and pelvic vasculature. No acute -appearing vascular abnormality seen. No aortic aneurysm. 7. Degenerative changes of the thoracolumbar spine, as detailed above. Electronically Signed   By: Franki Cabot M.D.   On: 03/31/2016 17:08    Scheduled Meds: . ALPRAZolam   1 mg Oral BID  . atorvastatin  10 mg Oral q morning - 10a  . ciprofloxacin  400 mg Intravenous Q12H  . diltiazem  240 mg Oral Daily  . DULoxetine  60 mg Oral q morning - 10a  . insulin aspart  0-5 Units Subcutaneous QHS  . insulin aspart  0-9 Units Subcutaneous TID WC  . lisinopril  5 mg Oral q morning - 10a  . metronidazole  500 mg Intravenous Q8H  . pantoprazole  40 mg Oral q morning - 10a   Continuous Infusions: . 0.9 % NaCl with KCl 20 mEq / L 75 mL/hr at 04/02/16 1439    Assessment and plan:  Principal Problem:   Colitis, infectious Active Problems:   Hematochezia   Diverticulosis of colon   Respiratory failure with hypoxia ([facility])   Chronic UTI   Essential hypertension   GERD (gastroesophageal reflux disease)   Acute blood loss anemia   Type 2 diabetes mellitus without complication ([place])     1. Acute colitis, presumed to be infectious.  On admission, CT scan of patient's abdomen and pelvis revealed thickening of the walls of a long segment of the descending and sigmoid colon, compatible with colitis; No evidence of abscess obstruction, or perforation; Colonic diverticulosis. -Patient was made to be nothing by mouth. She was started on Cipro and Flagyl. Supportive treatment was given with  as needed analgesics. Maintenance IV fluids were started. -Patient has not had further bowel movements so far. -Gastroenterology was consulted. Dr. Laural Golden ordered GI pathogen panel and C. Difficile PCR for additional workup; pending as the patient has not had a bowel movement. -Diet advance to full liquids.   Hematochezia/lower GI bleed, presumed to be secondary to colitis; Acute blood loss anemia.  Patient reported maroon colored rectal bleeding at home.  Her hemoglobin was 13.7 on admission. It has fallen with equilibration and IV fluids. -We'll continue to monitor her H&H. No requirement for blood transfusion.   Chronic urinary tract infections.  Patient's history is positive for  chronic UTIs. She takes Macrodantin when necessary.  On admission, her UA was positive for nitrite, many bacteria, and 6-30 WBCs. -Urine culture pending. -Continue antibiotics above.   Type 2 diabetes mellitus.  Patient was recently diagnosed with diabetes mellitus. She had been taking metformin, but ran out of the sample doses. She did not like taking metformin because it caused some nausea. Sliding scale NovoLog started. Hemoglobin A1c pending. CBGs are reasonable.   Hypertension.  Patient is treated chronically with diltiazem, HCTZ, and lisinopril. She was continued on diltiazem and lisinopril, but HCTZ is being held. Her blood pressure is stable.  Hypoxia/respiratory failure with hypoxia. Patient is not treated with oxygen at home. Patient has periodic desaturation on room air with oxygen saturation in the mid to upper 80s. Etiology could be atelectasis or obesity hypoventilation syndrome. We'll continue oxygen titrated to keep her oxygen saturations 90% or above. Will order a chest x-ray for further evaluation.   Time spent: 30 minutes    [facility] Pager  [phone]. If 7PM-7AM, please contact night-coverage at [email or web address], password [facility] 04/02/2016, 3:06 PM

## 2016-04-03 DIAGNOSIS — Z87891 Personal history of nicotine dependence: Secondary | ICD-10-CM | POA: Diagnosis not present

## 2016-04-03 DIAGNOSIS — K219 Gastro-esophageal reflux disease without esophagitis: Secondary | ICD-10-CM | POA: Diagnosis present

## 2016-04-03 DIAGNOSIS — N390 Urinary tract infection, site not specified: Secondary | ICD-10-CM | POA: Diagnosis not present

## 2016-04-03 DIAGNOSIS — Z803 Family history of malignant neoplasm of breast: Secondary | ICD-10-CM | POA: Diagnosis not present

## 2016-04-03 DIAGNOSIS — I10 Essential (primary) hypertension: Secondary | ICD-10-CM | POA: Diagnosis not present

## 2016-04-03 DIAGNOSIS — Z87440 Personal history of urinary (tract) infections: Secondary | ICD-10-CM | POA: Diagnosis not present

## 2016-04-03 DIAGNOSIS — Z90710 Acquired absence of both cervix and uterus: Secondary | ICD-10-CM | POA: Diagnosis not present

## 2016-04-03 DIAGNOSIS — J9691 Respiratory failure, unspecified with hypoxia: Secondary | ICD-10-CM | POA: Diagnosis present

## 2016-04-03 DIAGNOSIS — K5730 Diverticulosis of large intestine without perforation or abscess without bleeding: Secondary | ICD-10-CM | POA: Diagnosis present

## 2016-04-03 DIAGNOSIS — Z87442 Personal history of urinary calculi: Secondary | ICD-10-CM | POA: Diagnosis not present

## 2016-04-03 DIAGNOSIS — E119 Type 2 diabetes mellitus without complications: Secondary | ICD-10-CM | POA: Diagnosis present

## 2016-04-03 DIAGNOSIS — K921 Melena: Secondary | ICD-10-CM | POA: Diagnosis not present

## 2016-04-03 DIAGNOSIS — Z801 Family history of malignant neoplasm of trachea, bronchus and lung: Secondary | ICD-10-CM | POA: Diagnosis not present

## 2016-04-03 DIAGNOSIS — A09 Infectious gastroenteritis and colitis, unspecified: Secondary | ICD-10-CM

## 2016-04-03 DIAGNOSIS — Z800 Family history of malignant neoplasm of digestive organs: Secondary | ICD-10-CM | POA: Diagnosis not present

## 2016-04-03 DIAGNOSIS — D62 Acute posthemorrhagic anemia: Secondary | ICD-10-CM | POA: Diagnosis not present

## 2016-04-03 DIAGNOSIS — E785 Hyperlipidemia, unspecified: Secondary | ICD-10-CM | POA: Diagnosis present

## 2016-04-03 DIAGNOSIS — Z9049 Acquired absence of other specified parts of digestive tract: Secondary | ICD-10-CM | POA: Diagnosis not present

## 2016-04-03 DIAGNOSIS — K589 Irritable bowel syndrome without diarrhea: Secondary | ICD-10-CM | POA: Diagnosis present

## 2016-04-03 LAB — GASTROINTESTINAL PANEL BY PCR, STOOL (REPLACES STOOL CULTURE)
Adenovirus F40/41: NOT DETECTED
Astrovirus: NOT DETECTED
CAMPYLOBACTER SPECIES: NOT DETECTED
CRYPTOSPORIDIUM: NOT DETECTED
CYCLOSPORA CAYETANENSIS: NOT DETECTED
E. coli O157: NOT DETECTED
ENTEROAGGREGATIVE E COLI (EAEC): NOT DETECTED
Entamoeba histolytica: NOT DETECTED
Enteropathogenic E coli (EPEC): NOT DETECTED
Enterotoxigenic E coli (ETEC): NOT DETECTED
GIARDIA LAMBLIA: NOT DETECTED
Norovirus GI/GII: NOT DETECTED
PLESIMONAS SHIGELLOIDES: NOT DETECTED
ROTAVIRUS A: NOT DETECTED
SALMONELLA SPECIES: NOT DETECTED
SHIGA LIKE TOXIN PRODUCING E COLI (STEC): NOT DETECTED
SHIGELLA/ENTEROINVASIVE E COLI (EIEC): NOT DETECTED
Sapovirus (I, II, IV, and V): NOT DETECTED
VIBRIO CHOLERAE: NOT DETECTED
Vibrio species: NOT DETECTED
YERSINIA ENTEROCOLITICA: NOT DETECTED

## 2016-04-03 LAB — GLUCOSE, CAPILLARY
GLUCOSE-CAPILLARY: 124 mg/dL — AB (ref 65–99)
GLUCOSE-CAPILLARY: 163 mg/dL — AB (ref 65–99)
Glucose-Capillary: 100 mg/dL — ABNORMAL HIGH (ref 65–99)
Glucose-Capillary: 99 mg/dL (ref 65–99)

## 2016-04-03 LAB — CBC
HCT: 36.8 % (ref 36.0–46.0)
HEMOGLOBIN: 11.3 g/dL — AB (ref 12.0–15.0)
MCH: 29.3 pg (ref 26.0–34.0)
MCHC: 30.7 g/dL (ref 30.0–36.0)
MCV: 95.3 fL (ref 78.0–100.0)
PLATELETS: 207 10*3/uL (ref 150–400)
RBC: 3.86 MIL/uL — AB (ref 3.87–5.11)
RDW: 13.3 % (ref 11.5–15.5)
WBC: 8.9 10*3/uL (ref 4.0–10.5)

## 2016-04-03 LAB — HEMOGLOBIN A1C
HEMOGLOBIN A1C: 6.6 % — AB (ref 4.8–5.6)
Mean Plasma Glucose: 143 mg/dL

## 2016-04-03 LAB — URINE CULTURE

## 2016-04-03 LAB — BASIC METABOLIC PANEL
ANION GAP: 10 (ref 5–15)
BUN: 6 mg/dL (ref 6–20)
CALCIUM: 8.8 mg/dL — AB (ref 8.9–10.3)
CHLORIDE: 103 mmol/L (ref 101–111)
CO2: 29 mmol/L (ref 22–32)
CREATININE: 0.5 mg/dL (ref 0.44–1.00)
GFR calc non Af Amer: >60 mL/min (ref >60)
Glucose, Bld: 139 mg/dL — ABNORMAL HIGH (ref 65–99)
Potassium: 3.8 mmol/L (ref 3.5–5.1)
SODIUM: 142 mmol/L (ref 135–145)

## 2016-04-03 MED ORDER — METRONIDAZOLE 500 MG PO TABS
500.0000 mg | ORAL_TABLET | Freq: Three times a day (TID) | ORAL | Status: DC
  Administered 2016-04-03 – 2016-04-04 (×3): 500 mg via ORAL
  Filled 2016-04-03 (×3): qty 1

## 2016-04-03 MED ORDER — LOPERAMIDE HCL 2 MG PO CAPS
2.0000 mg | ORAL_CAPSULE | Freq: Two times a day (BID) | ORAL | Status: AC
  Administered 2016-04-03 – 2016-04-04 (×3): 2 mg via ORAL
  Filled 2016-04-03 (×3): qty 1

## 2016-04-03 NOTE — Progress Notes (Signed)
  Subjective:  Patient feels better. She states she had 5 bowel movements in the last 24 hours into her large. She is only noted small amount of blood with her bowel movements. She says lower abdominal pain is not as intense. She is not nauseated and hungry and wants to try regular food.   Objective: Blood pressure 118/62, pulse 66, temperature 98.1 F (36.7 C), temperature source Oral, resp. rate 18, height 5\' 1"  (1.549 m), weight 176 lb 5.9 oz (80 kg), SpO2 93 %. Patient is alert and appears to be comfortable. Abdomen is symmetrical. Bowel sounds are normal. On palpation abdomen is soft with mild tenderness across lower half of the abdomen without guarding or rebound. No LE edema or clubbing noted.  Labs/studies Results:   Recent Labs  04/01/16 0602 04/02/16 0600 04/03/16 0514  WBC 12.4* 12.6* 8.9  HGB 11.4* 12.8 11.3*  HCT 36.0 41.4 36.8  PLT 216 257 207    BMET   Recent Labs  04/01/16 0602 04/02/16 0600 04/03/16 0514  NA 139 141 142  K 4.0 4.2 3.8  CL 106 104 103  CO2 26 27 29   GLUCOSE 179* 160* 139*  BUN 14 7 6   CREATININE 0.55 0.54 0.50  CALCIUM 8.3* 9.0 8.8*    LFT   Recent Labs  03/31/16 1433  PROT 7.8  ALBUMIN 4.2  AST 31  ALT 28  ALKPHOS 80  BILITOT 0.5    Stool C. difficile antigen negative. Stool C. difficile toxin negative. GI pathogen panel is pending.  Assessment:  #1. Acute colitis most likely secondary to an infection. Symptomatic improvement in the last 24 hours. Stool tests for C. difficile colitis is negative. Leukocytosis is resolved and appetite is returning. #2. Mild anemia secondary to acute illness.  Recommendations:  Advance diet to soft low residue diet and yogurt with each meal. Loperamide OTC 2 mg by mouth twice a day 3 doses.

## 2016-04-03 NOTE — Progress Notes (Addendum)
[facility] PROGRESS NOTE  Jodi Webb [id number] DOB: 09/05/1932 DOA: 03/31/2016 PCP: [name], DO    Code Status: full code  Family Communication: discussed with daughter Disposition Plan: discharge when clinically appropriate, likely in the next 24-48 hours   Consultants:   Gastroenterology  Procedures:   None  Antibiotics:   Cipro 03/31/16>>  Flagyl 03/31/16>>  HPI/Subjective:  Patient still denies diarrhea. She had a small bowel movement a couple days ago. Her abdominal pain is not quite as bad. She denies nausea vomiting.  Objective: Filed Vitals:   04/03/16 0430 04/03/16 1300  BP: 118/62 124/46  Pulse: 66 63  Temp: 98.1 F (36.7 C) 98.1 F (36.7 C)  Resp: 18 18   Oxygen saturation 98% on Oxygen and 91% on room air.  Intake/Output Summary (Last 24 hours) at 04/03/16 1501 Last data filed at 04/03/16 1400  Gross per 24 hour  Intake    980 ml  Output      0 ml  Net    980 ml   Filed Weights   03/31/16 1358 03/31/16 1939  Weight: 77.111 kg (170 lb) 80 kg (176 lb 5.9 oz)    Exam:   General:   Pleasant 82 year old woman laying in bed, in no acute distress.  Cardiovascular:  S1, S2, no murmurs rubs or gallops.  Respiratory:  Decreased breath sounds at the bases and clear anteriorly. Breathing nonlabored.  Abdomen:  Positive bowel sounds, soft, Less tenderness over the lower abdomen and left lower quadrant.  Musculoskeletal/Extremities: No pedal edema.  Neurologic: She is alert and oriented 2. Cranial nerves II through XII are grossly intact.   Data Reviewed: Basic Metabolic Panel:  Recent Labs Lab 03/31/16 1433 04/01/16 0602 04/02/16 0600 04/03/16 0514  NA 140 139 141 142  K 4.3 4.0 4.2 3.8  CL 104 106 104 103  CO2 26 26 27 29   GLUCOSE 151* 179* 160* 139*  BUN 21* 14 7 6   CREATININE 0.67 0.55 0.54 0.50  CALCIUM 9.2 8.3* 9.0 8.8*   Liver Function Tests:  Recent Labs Lab 03/31/16 1433  AST 31  ALT 28  ALKPHOS 80   BILITOT 0.5  PROT 7.8  ALBUMIN 4.2    Recent Labs Lab 03/31/16 1433  LIPASE 25   No results for input(s): AMMONIA in the last 168 hours. CBC:  Recent Labs Lab 03/31/16 1433 04/01/16 0602 04/02/16 0600 04/03/16 0514  WBC 20.4* 12.4* 12.6* 8.9  HGB 13.7 11.4* 12.8 11.3*  HCT 42.9 36.0 41.4 36.8  MCV 93.3 95.5 96.5 95.3  PLT 253 216 257 207   Cardiac Enzymes: No results for input(s): CKTOTAL, CKMB, CKMBINDEX, TROPONINI in the last 168 hours. BNP (last 3 results) No results for input(s): BNP in the last 8760 hours.  ProBNP (last 3 results) No results for input(s): PROBNP in the last 8760 hours.  CBG:  Recent Labs Lab 04/02/16 1059 04/02/16 1659 04/02/16 2049 04/03/16 0729 04/03/16 1055  GLUCAP 109* 76 103* 124* 163*    Recent Results (from the past 240 hour(s))  Culture, Urine     Status: None   Collection Time: 03/31/16  2:02 PM  Result Value Ref Range Status   Specimen Description URINE, CLEAN CATCH  Final   Special Requests NONE  Final   Culture   Final    >=100,000 COLONIES/mL ESCHERICHIA COLI Performed at [facility]    Report Status 04/03/2016 FINAL  Final   Organism ID, Bacteria ESCHERICHIA COLI  Final  Susceptibility   Escherichia coli - MIC*    AMPICILLIN >=32 RESISTANT Resistant     CEFAZOLIN <=4 SENSITIVE Sensitive     CEFTRIAXONE <=1 SENSITIVE Sensitive     CIPROFLOXACIN >=4 RESISTANT Resistant     GENTAMICIN <=1 SENSITIVE Sensitive     IMIPENEM <=0.25 SENSITIVE Sensitive     NITROFURANTOIN 256 RESISTANT Resistant     TRIMETH/SULFA <=20 SENSITIVE Sensitive     AMPICILLIN/SULBACTAM >=32 RESISTANT Resistant     PIP/TAZO <=4 SENSITIVE Sensitive     * >=100,000 COLONIES/mL ESCHERICHIA COLI  Gastrointestinal Panel by PCR , Stool     Status: None   Collection Time: 04/02/16  1:43 PM  Result Value Ref Range Status   Campylobacter species NOT DETECTED NOT DETECTED Final   Plesimonas shigelloides NOT DETECTED NOT DETECTED Final    Salmonella species NOT DETECTED NOT DETECTED Final   Yersinia enterocolitica NOT DETECTED NOT DETECTED Final   Vibrio species NOT DETECTED NOT DETECTED Final   Vibrio cholerae NOT DETECTED NOT DETECTED Final   Enteroaggregative E coli (EAEC) NOT DETECTED NOT DETECTED Final   Enteropathogenic E coli (EPEC) NOT DETECTED NOT DETECTED Final   Enterotoxigenic E coli (ETEC) NOT DETECTED NOT DETECTED Final   Shiga like toxin producing E coli (STEC) NOT DETECTED NOT DETECTED Final   E. coli O157 NOT DETECTED NOT DETECTED Final   Shigella/Enteroinvasive E coli (EIEC) NOT DETECTED NOT DETECTED Final   Cryptosporidium NOT DETECTED NOT DETECTED Final   Cyclospora cayetanensis NOT DETECTED NOT DETECTED Final   Entamoeba histolytica NOT DETECTED NOT DETECTED Final   Giardia lamblia NOT DETECTED NOT DETECTED Final   Adenovirus F40/41 NOT DETECTED NOT DETECTED Final   Astrovirus NOT DETECTED NOT DETECTED Final   Norovirus GI/GII NOT DETECTED NOT DETECTED Final   Rotavirus A NOT DETECTED NOT DETECTED Final   Sapovirus (I, II, IV, and V) NOT DETECTED NOT DETECTED Final  C difficile quick scan w PCR reflex     Status: None   Collection Time: 04/02/16  1:43 PM  Result Value Ref Range Status   C Diff antigen NEGATIVE NEGATIVE Final   C Diff toxin NEGATIVE NEGATIVE Final   C Diff interpretation Negative for toxigenic C. difficile  Final     Studies: Dg Chest 2 View  04/02/2016  CLINICAL DATA:  Shortness of breath, diarrhea, nausea, vomiting, diabetes mellitus, hypertension, GERD EXAM: CHEST  2 VIEW COMPARISON:  06/28/2008 FINDINGS: Enlargement of cardiac silhouette. Atherosclerotic calcification aorta. Moderate to large hiatal hernia. Pulmonary vascularity normal. Lungs clear. No pleural effusion or pneumothorax. Bones demineralized with mild degenerative disc disease changes thoracic spine. IMPRESSION: Enlargement of cardiac silhouette. Hiatal hernia. No acute abnormalities. Electronically Signed   By: Lavonia Dana M.D.   On: 04/02/2016 18:54    Scheduled Meds: . ALPRAZolam  1 mg Oral BID  . atorvastatin  10 mg Oral q morning - 10a  . ciprofloxacin  400 mg Intravenous Q12H  . diltiazem  240 mg Oral Daily  . DULoxetine  60 mg Oral q morning - 10a  . insulin aspart  0-5 Units Subcutaneous QHS  . insulin aspart  0-9 Units Subcutaneous TID WC  . lisinopril  5 mg Oral q morning - 10a  . loperamide  2 mg Oral BID  . metronidazole  500 mg Intravenous Q8H  . pantoprazole  40 mg Oral q morning - 10a   Continuous Infusions: . 0.9 % NaCl with KCl 20 mEq / L 50 mL/hr at 04/02/16  1517    Assessment and plan:  Principal Problem:   Colitis, infectious Active Problems:   Hematochezia   Diverticulosis of colon   Respiratory failure with hypoxia (HCC)   Chronic UTI   Essential hypertension   GERD (gastroesophageal reflux disease)   Acute blood loss anemia   Type 2 diabetes mellitus without complication ([place])     1. Acute colitis, presumed to be infectious.  On admission, CT scan of patient's abdomen and pelvis revealed thickening of the walls of a long segment of the descending and sigmoid colon, compatible with colitis; No evidence of abscess obstruction, or perforation; Colonic diverticulosis. -Patient was made to be nothing by mouth. She was started on Cipro and Flagyl. Supportive treatment was given with as needed analgesics. Maintenance IV fluids were started. -Gastroenterology was consulted. Dr. Laural Golden ordered GI pathogen panel and C. Difficile PCR for additional workup-Both were negative. Diet advance to soft diet today by Dr. Laural Golden.Patient is improving clinically and symptomatically. Her white blood cell count has trended downward. -We'll transition Flagyl  to by mouth today and Cipro tomorrow. -If patient tolerates her diet, she could likely be discharged home tomorrow.   Hematochezia/lower GI bleed, presumed to be secondary to colitis; Acute blood loss anemia.  Patient reported maroon  colored rectal bleeding at home.  Her hemoglobin was 13.7 on admission. It has fallen with equilibration and IV fluids. -We'll continue to monitor her H&H. No requirement for blood transfusion. -Future colonoscopy per Dr. Laural Golden.   Chronic urinary tract infections.  Patient's history is positive for chronic UTIs. She takes Macrodantin when necessary.  On admission, her UA was positive for nitrite, many bacteria, and 6-30 WBCs. -Urine culture reveals Escherichia coli with some resistance. Since patient has no active dysuria, I would not treat this Escherichia coli which could be colonization.   Type 2 diabetes mellitus.  Patient was recently diagnosed with diabetes mellitus. She had been taking metformin, but ran out of the sample doses. She did not like taking metformin because it caused some nausea. Sliding scale NovoLog started. Hemoglobin A1c was 6.6. Her CBGs have been reasonable. -Consider small dose Amaryl or restarting metformin or daily at bedtime Lantus for home treatment at the time of discharge.   Hypertension.  Patient is treated chronically with diltiazem, HCTZ, and lisinopril. She was continued on diltiazem and lisinopril, but HCTZ is being held. Her blood pressure is stable.  Hypoxia/respiratory failure with hypoxia. Patient is not treated with oxygen at home. Patient has periodic desaturation on room air with oxygen saturation in the mid to upper 80s. Etiology could be atelectasis or obesity hypoventilation syndrome. We'll continue oxygen titrated to keep her oxygen saturations 90% or above. Chest x-ray ordered for evaluation and revealed no acute abnormalities. Will order incentive spirometry.   Time spent: 30 minutes    [facility] Pager  [phone]. If 7PM-7AM, please contact night-coverage at [email or web address], password [facility] 04/03/2016, 3:01 PM

## 2016-04-04 LAB — BASIC METABOLIC PANEL
Anion gap: 9 (ref 5–15)
BUN: 7 mg/dL (ref 6–20)
CALCIUM: 8.9 mg/dL (ref 8.9–10.3)
CO2: 31 mmol/L (ref 22–32)
CREATININE: 0.54 mg/dL (ref 0.44–1.00)
Chloride: 104 mmol/L (ref 101–111)
GFR calc Af Amer: >60 mL/min (ref >60)
GLUCOSE: 125 mg/dL — AB (ref 65–99)
Potassium: 4 mmol/L (ref 3.5–5.1)
Sodium: 144 mmol/L (ref 135–145)

## 2016-04-04 LAB — GLUCOSE, CAPILLARY
Glucose-Capillary: 143 mg/dL — ABNORMAL HIGH (ref 65–99)
Glucose-Capillary: 118 mg/dL — ABNORMAL HIGH (ref 65–99)

## 2016-04-04 MED ORDER — CIPROFLOXACIN HCL 500 MG PO TABS: 500.0000 mg | ORAL_TABLET | Freq: Two times a day (BID) | ORAL | Status: DC

## 2016-04-04 MED ORDER — METRONIDAZOLE 500 MG PO TABS: 500.0000 mg | ORAL_TABLET | Freq: Three times a day (TID) | ORAL | Status: DC

## 2016-04-04 NOTE — Care Management Note (Signed)
Case Management Note  Patient Details  Name: Jodi Webb MRN: [id number] Date of Birth: 02/12/1932  Expected Discharge Date:       04/04/2016           Expected Discharge Plan:  Home/Self Care  In-House Referral:  NA  Discharge planning Services  CM Consult  Post Acute Care Choice:  NA Choice offered to:  NA  DME Arranged:    DME Agency:     HH Arranged:    [place] Agency:     Status of Service:  Completed, signed off  Medicare Important Message Given:  Yes Date Medicare IM Given:    Medicare IM give by:    Date Additional Medicare IM Given:    Additional Medicare Important Message give by:     If discussed at [place] of Stay Meetings, dates discussed:    Additional Comments: Pt discharging home with self care today. No CM needs.  Sherald Barge, RN 04/04/2016, 1:24 PM

## 2016-04-04 NOTE — Discharge Summary (Signed)
Physician Discharge Summary  Jodi Webb [id number] DOB: 1932/04/29 DOA: 03/31/2016  PCP: [name], DO  Admit date: 03/31/2016 Discharge date: 04/04/2016  Time spent: 45 minutes  Recommendations for Outpatient Follow-up:  -We'll be discharged home today. -Has 5 days of Cipro and Flagyl pending at time of discharge.  -2 follow-up with Dr. Melony Overly as scheduled by his office.   Discharge Diagnoses:  Principal Problem:   Colitis, infectious Active Problems:   Essential hypertension   GERD (gastroesophageal reflux disease)   Hematochezia   Acute blood loss anemia   Type 2 diabetes mellitus without complication ([place])   Diverticulosis of colon   Chronic UTI   Respiratory failure with hypoxia [facility])   Discharge Condition: Stable and improved  Filed Weights   03/31/16 1358 03/31/16 1939  Weight: 77.111 kg (170 lb) 80 kg (176 lb 5.9 oz)    History of present illness:  As per Dr. Nehemiah Settle on 4/1: Jodi Webb is a 82 y.o. female  With a history of hypertension, hyperlipidemia, GERD, IBS, history kidney stones. Patient has been having worsening diarrhea over the past week with chills, left lower quadrant abdominal pain and nausea. Her abdominal pain radiating into her back. Not improved with food or liquid. Not improved with stooling. Started having maroon rectal bleeding this morning. Provoking or palliating factors. She has never had this before.  Hospital Course:   Acute colitis, presumed infectious -Has improved, steroid and has decreased. -Continue Cipro and Flagyl for 5 more days at time of discharge. -GI pathogen panel and C. difficile PCR have been negative. -Tolerating full diet.  Type 2 diabetes -Fair control   Procedures:  None   Consultations:  GI, Dr. Laural Golden  Discharge Instructions  Discharge Instructions    Diet - low sodium heart healthy    Complete by:  As directed      Increase activity slowly    Complete by:  As directed               Medication List    STOP taking these medications        diclofenac 75 MG EC tablet  Commonly known as:  VOLTAREN      TAKE these medications        acidophilus Caps capsule  Take 1 capsule by mouth daily.     ALPRAZolam 1 MG tablet  Commonly known as:  XANAX  Take 1 mg by mouth 2 (two) times daily.     aspirin EC 81 MG tablet  Take 81 mg by mouth every morning.     atorvastatin 10 MG tablet  Commonly known as:  LIPITOR  Take 10 mg by mouth every morning.     cholecalciferol 1000 units tablet  Commonly known as:  VITAMIN D  Take 5,000 Units by mouth daily.     ciprofloxacin 500 MG tablet  Commonly known as:  CIPRO  Take 1 tablet (500 mg total) by mouth 2 (two) times daily.     diltiazem 240 MG 24 hr capsule  Commonly known as:  DILACOR XR  Take 240 mg by mouth daily.     DULoxetine 60 MG capsule  Commonly known as:  CYMBALTA  Take 60 mg by mouth every morning.     FISH OIL PO  Take 1 capsule by mouth daily.     hydrochlorothiazide 12.5 MG capsule  Commonly known as:  MICROZIDE  Take 12.5 mg by mouth daily.     lisinopril 5 MG tablet  Commonly known as:  PRINIVIL,ZESTRIL  Take 5 mg by mouth every morning.     metroNIDAZOLE 500 MG tablet  Commonly known as:  FLAGYL  Take 1 tablet (500 mg total) by mouth every 8 (eight) hours.     multivitamins ther. w/minerals Tabs tablet  Take 1 tablet by mouth daily.     pantoprazole 40 MG tablet  Commonly known as:  PROTONIX  Take 40 mg by mouth every morning.     promethazine 25 MG tablet  Commonly known as:  PHENERGAN  Take 25 mg by mouth every 6 (six) hours as needed for nausea or vomiting.     VITAMIN B-12 PO  Take 1 tablet by mouth daily.       No Known Allergies     Follow-up Information    Follow up with [place], DO. Schedule an appointment as soon as possible for a visit in 2 weeks.   Specialty:  Family Medicine   Contact information:   [address] [place]  [address] [phone]        The results of significant diagnostics from this hospitalization (including imaging, microbiology, ancillary and laboratory) are listed below for reference.    Significant Diagnostic Studies: Dg Chest 2 View  04/02/2016  CLINICAL DATA:  Shortness of breath, diarrhea, nausea, vomiting, diabetes mellitus, hypertension, GERD EXAM: CHEST  2 VIEW COMPARISON:  06/28/2008 FINDINGS: Enlargement of cardiac silhouette. Atherosclerotic calcification aorta. Moderate to large hiatal hernia. Pulmonary vascularity normal. Lungs clear. No pleural effusion or pneumothorax. Bones demineralized with mild degenerative disc disease changes thoracic spine. IMPRESSION: Enlargement of cardiac silhouette. Hiatal hernia. No acute abnormalities. Electronically Signed   By: Lavonia Dana M.D.   On: 04/02/2016 18:54   Ct Abdomen Pelvis W Contrast  03/31/2016  CLINICAL DATA:  Diarrhea since the beginning of the week with lower abdominal pain starting yesterday, bright red blood in stools this morning. History of kidney stones, high BS, diverticulitis and lumbar fracture. EXAM: CT ABDOMEN AND PELVIS WITH CONTRAST TECHNIQUE: Multidetector CT imaging of the abdomen and pelvis was performed using the standard protocol following bolus administration of intravenous contrast. CONTRAST:  75mL OMNIPAQUE IOHEXOL 300 MG/ML  SOLN COMPARISON:  Chest and abdominal x-rays dated 06/28/2008. FINDINGS: Lower chest: Hiatal hernia, moderate in size. Adjacent lung bases are clear. Hepatobiliary: Status post cholecystectomy with associated bile duct ectasia. Liver appears normal, perhaps mild fatty infiltration. Pancreas: No mass, inflammatory changes, or other significant abnormality. Spleen: Within normal limits in size and appearance. Adrenals/Urinary Tract: Adrenal glands appear normal. Multiple renal cysts bilaterally, largest is on the left measuring 7 cm greatest dimension. 2 mm left renal stone. No ureteral or bladder  calculi identified. Bladder appears normal, partially decompressed. Stomach/Bowel: There is thickening of the walls of a long segment of the sigmoid and descending colon, involving the mid descending colon to the lower sigmoid colon, suggesting acute colitis of infectious, inflammatory or ischemic nature. Scattered diverticulosis noted within the sigmoid colon. No dilated large or small bowel loops. Appendix is not seen but there are no inflammatory changes about the cecum to suggest acute appendicitis. Vascular/Lymphatic: Prominent atherosclerotic changes of the normal- aorta and pelvic vasculature. No acute-appearing vascular abnormality seen. No enlarged lymph nodes seen. Reproductive: Status post hysterectomy. Other: No free fluid or abscess collection seen. No free intraperitoneal air. Musculoskeletal: No acute or suspicious osseous lesion. Degenerative change throughout the thoracolumbar spine, moderate in degree, with associated disc bulges at multiple levels of the lumbar spine causing mild-to-moderate degrees  of central canal stenosis, most significant stenosis at the L5-S1 level with possible associated nerve root impingement. IMPRESSION: 1. Thickening of the walls of a long segment of the descending and sigmoid colon, compatible with acute colitis of infectious, inflammatory or ischemic nature. There is associated paracolic inflammation. No abscess collection. No evidence of bowel perforation. No associated bowel obstruction. 2. Colonic diverticulosis. 3. Bilateral renal cysts. Also 2 mm nonobstructing left renal stone. 4. Hiatal hernia, moderate in size. 5. Probable mild fatty infiltration of the liver. 6. Fairly prominent atherosclerotic changes of the abdominal aorta and pelvic vasculature. No acute -appearing vascular abnormality seen. No aortic aneurysm. 7. Degenerative changes of the thoracolumbar spine, as detailed above. Electronically Signed   By: Franki Cabot M.D.   On: 03/31/2016 17:08     Microbiology: Recent Results (from the past 240 hour(s))  Culture, Urine     Status: None   Collection Time: 03/31/16  2:02 PM  Result Value Ref Range Status   Specimen Description URINE, CLEAN CATCH  Final   Special Requests NONE  Final   Culture   Final    >=100,000 COLONIES/mL ESCHERICHIA COLI Performed at [facility]    Report Status 04/03/2016 FINAL  Final   Organism ID, Bacteria ESCHERICHIA COLI  Final      Susceptibility   Escherichia coli - MIC*    AMPICILLIN >=32 RESISTANT Resistant     CEFAZOLIN <=4 SENSITIVE Sensitive     CEFTRIAXONE <=1 SENSITIVE Sensitive     CIPROFLOXACIN >=4 RESISTANT Resistant     GENTAMICIN <=1 SENSITIVE Sensitive     IMIPENEM <=0.25 SENSITIVE Sensitive     NITROFURANTOIN 256 RESISTANT Resistant     TRIMETH/SULFA <=20 SENSITIVE Sensitive     AMPICILLIN/SULBACTAM >=32 RESISTANT Resistant     PIP/TAZO <=4 SENSITIVE Sensitive     * >=100,000 COLONIES/mL ESCHERICHIA COLI  Gastrointestinal Panel by PCR , Stool     Status: None   Collection Time: 04/02/16  1:43 PM  Result Value Ref Range Status   Campylobacter species NOT DETECTED NOT DETECTED Final   Plesimonas shigelloides NOT DETECTED NOT DETECTED Final   Salmonella species NOT DETECTED NOT DETECTED Final   Yersinia enterocolitica NOT DETECTED NOT DETECTED Final   Vibrio species NOT DETECTED NOT DETECTED Final   Vibrio cholerae NOT DETECTED NOT DETECTED Final   Enteroaggregative E coli (EAEC) NOT DETECTED NOT DETECTED Final   Enteropathogenic E coli (EPEC) NOT DETECTED NOT DETECTED Final   Enterotoxigenic E coli (ETEC) NOT DETECTED NOT DETECTED Final   Shiga like toxin producing E coli (STEC) NOT DETECTED NOT DETECTED Final   E. coli O157 NOT DETECTED NOT DETECTED Final   Shigella/Enteroinvasive E coli (EIEC) NOT DETECTED NOT DETECTED Final   Cryptosporidium NOT DETECTED NOT DETECTED Final   Cyclospora cayetanensis NOT DETECTED NOT DETECTED Final   Entamoeba histolytica NOT  DETECTED NOT DETECTED Final   Giardia lamblia NOT DETECTED NOT DETECTED Final   Adenovirus F40/41 NOT DETECTED NOT DETECTED Final   Astrovirus NOT DETECTED NOT DETECTED Final   Norovirus GI/GII NOT DETECTED NOT DETECTED Final   Rotavirus A NOT DETECTED NOT DETECTED Final   Sapovirus (I, II, IV, and V) NOT DETECTED NOT DETECTED Final  C difficile quick scan w PCR reflex     Status: None   Collection Time: 04/02/16  1:43 PM  Result Value Ref Range Status   C Diff antigen NEGATIVE NEGATIVE Final   C Diff toxin NEGATIVE NEGATIVE Final   C  Diff interpretation Negative for toxigenic C. difficile  Final     Labs: Basic Metabolic Panel:  Recent Labs Lab 03/31/16 1433 04/01/16 0602 04/02/16 0600 04/03/16 0514 04/04/16 0547  NA 140 139 141 142 144  K 4.3 4.0 4.2 3.8 4.0  CL 104 106 104 103 104  CO2 26 26 27 29 31   GLUCOSE 151* 179* 160* 139* 125*  BUN 21* 14 7 6 7   CREATININE 0.67 0.55 0.54 0.50 0.54  CALCIUM 9.2 8.3* 9.0 8.8* 8.9   Liver Function Tests:  Recent Labs Lab 03/31/16 1433  AST 31  ALT 28  ALKPHOS 80  BILITOT 0.5  PROT 7.8  ALBUMIN 4.2    Recent Labs Lab 03/31/16 1433  LIPASE 25   No results for input(s): AMMONIA in the last 168 hours. CBC:  Recent Labs Lab 03/31/16 1433 04/01/16 0602 04/02/16 0600 04/03/16 0514  WBC 20.4* 12.4* 12.6* 8.9  HGB 13.7 11.4* 12.8 11.3*  HCT 42.9 36.0 41.4 36.8  MCV 93.3 95.5 96.5 95.3  PLT 253 216 257 207   Cardiac Enzymes: No results for input(s): CKTOTAL, CKMB, CKMBINDEX, TROPONINI in the last 168 hours. BNP: BNP (last 3 results) No results for input(s): BNP in the last 8760 hours.  ProBNP (last 3 results) No results for input(s): PROBNP in the last 8760 hours.  CBG:  Recent Labs Lab 04/03/16 1055 04/03/16 1645 04/03/16 2010 04/04/16 0728 04/04/16 1150  GLUCAP 163* 100* 99 143* 118*       Signed:  HERNANDEZ ACOSTA,ESTELA  [facility] Pager: [phone] 04/04/2016, 5:45 PM

## 2016-04-04 NOTE — Progress Notes (Signed)
Patient alert and oriented, independent, VSS, pt. Tolerating diet well. No complaints of pain or nausea. Pt. Had IV removed tip intact. Pt. Had prescriptions given. Pt. Voiced understanding of discharge instructions with no further questions. Pt. Discharged via wheelchair with auxilliary.  

## 2016-04-04 NOTE — Progress Notes (Signed)
  Subjective:  Patient has no complaints. She did not having problems with low-residue diet. Is not a bowel movement this morning. She has mild pain in left low quadrant of her abdomen.   Objective: Blood pressure 120/64, pulse 66, temperature 98.1 F (36.7 C), temperature source Oral, resp. rate 18, height 5\' 1"  (1.549 m), weight 176 lb 5.9 oz (80 kg), SpO2 94 %. Patient is alert and in no acute distress. Abdomen is soft mild tenderness. No organomegaly or masses.  Labs/studies Results:   Recent Labs  04/02/16 0600 04/03/16 0514  WBC 12.6* 8.9  HGB 12.8 11.3*  HCT 41.4 36.8  PLT 257 207    BMET   Recent Labs  04/02/16 0600 04/03/16 0514 04/04/16 0547  NA 141 142 144  K 4.2 3.8 4.0  CL 104 103 104  CO2 27 29 31   GLUCOSE 160* 139* 125*  BUN 7 6 7   CREATININE 0.54 0.50 0.54  CALCIUM 9.0 8.8* 8.9    GI Pathogen is negative.    Assessment:  Acute colitis felt to be due to infection but stool studies are negative. Patient tolerating low-residue diet. Scars for Dr. Jerilee Hoh. Patient is ready for discharge.  Recommendations:  Continue Cipro and metronidazole for 4 more days. Should advised to take probiotic daily for a few weeks. If symptoms relapse will consider sigmoidoscopy.

## 2016-04-04 NOTE — Care Management Important Message (Signed)
Important Message  Patient Details  Name: Jodi Webb MRN: [id number] Date of Birth: 1932/12/01   Medicare Important Message Given:  Yes    Sherald Barge, RN 04/04/2016, 1:15 PM

## 2016-04-04 NOTE — Care Management Obs Status (Signed)
[facility] NOTIFICATION   Patient Details  Name: NEIDY WINKELMAN MRN: [id number] Date of Birth: 17-Feb-1932   Medicare Observation Status Notification Given:  Yes    Sherald Barge, RN 04/04/2016, 1:16 PM

## 2016-05-01 ENCOUNTER — Ambulatory Visit (INDEPENDENT_AMBULATORY_CARE_PROVIDER_SITE_OTHER): Payer: Medicare Other | Admitting: Internal Medicine

## 2016-05-01 ENCOUNTER — Encounter (INDEPENDENT_AMBULATORY_CARE_PROVIDER_SITE_OTHER): Payer: Self-pay | Admitting: Internal Medicine

## 2016-05-01 VITALS — BP 130/90 | HR 66 | Temp 98.5°F | Resp 18 | Ht 62.0 in | Wt 169.1 lb

## 2016-05-01 DIAGNOSIS — A09 Infectious gastroenteritis and colitis, unspecified: Secondary | ICD-10-CM | POA: Diagnosis not present

## 2016-05-01 DIAGNOSIS — K921 Melena: Secondary | ICD-10-CM

## 2016-05-01 DIAGNOSIS — R197 Diarrhea, unspecified: Secondary | ICD-10-CM

## 2016-05-01 MED ORDER — LOPERAMIDE HCL 2 MG PO TABS
1.0000 mg | ORAL_TABLET | Freq: Every day | ORAL | Status: DC
Start: 2016-05-01 — End: 2017-09-18

## 2016-05-01 MED ORDER — PSYLLIUM 28 % PO PACK: 1.0000 | PACK | Freq: Every day | ORAL | Status: DC

## 2016-05-01 NOTE — Patient Instructions (Signed)
Metamucil half to 1 packet mouth daily at bedtime. Take Imodium OTC 1 mg daily with breakfast. Stool diary as to frequency and consistency of stools for next 6 weeks and please call with progress report. Will consider flexible sigmoidoscopy if you have any more episodes of rectal bleeding.

## 2016-05-01 NOTE — Progress Notes (Signed)
Presenting complaint;  Follow-up for diarrhea for which she was recently hospitalized.  Subjective:  Patient is 82 year old Caucasian female who is here for scheduled visit accompanied by her daughter Lenna Sciara. She was hospitalized last month for acute onset of diarrhea. CT revealed wall thickening to distal half of the colon. Stool studies were negative. She was empirically treated with antibiotic. She states she did well for few days after discharge but she is back to having diarrhea again. However when she takes one Imodium which is 2 mg she becomes constipated. She is not had fever nausea or vomiting. Most of her stools are mushy small to moderate amount and she is having 6-7 stools per day but today she is already had 9 bowel movements. She is also having nocturnal bowel movement cup of times a week. She had one episode of rectal bleeding 2 days ago with a bowel movement. She has not lost any weight. Patient's daughter states they looked-year-old records and last colonoscopy was 2 and half years ago. She will try to send these records to our office.    Current Medications: Outpatient Encounter Prescriptions as of 05/01/2016  Medication Sig  . acetaminophen (TYLENOL) 500 MG tablet Take 500 mg by mouth as needed.  Marland Kitchen acidophilus (RISAQUAD) CAPS capsule Take 1 capsule by mouth daily.  Marland Kitchen ALPRAZolam (XANAX) 1 MG tablet Take 1 mg by mouth 2 (two) times daily.  Marland Kitchen aspirin EC 81 MG tablet Take 81 mg by mouth every morning.  Marland Kitchen atorvastatin (LIPITOR) 10 MG tablet Take 10 mg by mouth every morning.  . Cyanocobalamin (VITAMIN B-12 PO) Take 1 tablet by mouth daily.  Marland Kitchen diltiazem (DILACOR XR) 240 MG 24 hr capsule Take 240 mg by mouth daily.  . DULoxetine (CYMBALTA) 60 MG capsule Take 60 mg by mouth every morning.   Marland Kitchen lisinopril (PRINIVIL,ZESTRIL) 5 MG tablet Take 5 mg by mouth every morning.  . metFORMIN (GLUCOPHAGE) 500 MG tablet Take 500 mg by mouth daily with breakfast.   . Multiple Vitamins-Minerals  (MULTIVITAMINS THER. W/MINERALS) TABS tablet Take 1 tablet by mouth daily.  . pantoprazole (PROTONIX) 40 MG tablet Take 40 mg by mouth every morning.  . promethazine (PHENERGAN) 25 MG tablet Take 25 mg by mouth every 6 (six) hours as needed for nausea or vomiting.  . cholecalciferol (VITAMIN D) 1000 UNITS tablet Take 5,000 Units by mouth daily. Reported on 05/01/2016  . metroNIDAZOLE (FLAGYL) 500 MG tablet Take 1 tablet (500 mg total) by mouth every 8 (eight) hours. (Patient not taking: Reported on 05/01/2016)  . Omega-3 Fatty Acids (FISH OIL PO) Take 1 capsule by mouth daily. Reported on 05/01/2016  . [DISCONTINUED] ciprofloxacin (CIPRO) 500 MG tablet Take 1 tablet (500 mg total) by mouth 2 (two) times daily. (Patient not taking: Reported on 05/01/2016)  . [DISCONTINUED] hydrochlorothiazide (MICROZIDE) 12.5 MG capsule Take 12.5 mg by mouth daily. Reported on 05/01/2016   No facility-administered encounter medications on file as of 05/01/2016.     Objective: Blood pressure 130/90, pulse 66, temperature 98.5 F (36.9 C), temperature source Oral, resp. rate 18, height 5\' 2"  (1.575 m), weight 169 lb 1.6 oz (76.703 kg). Patient is alert and appears to be in no acute distress Conjunctiva is pink. Sclera is nonicteric Oropharyngeal mucosa is normal. No neck masses or thyromegaly noted. Cardiac exam with regular rhythm normal S1 and S2. No murmur or gallop noted. Lungs are clear to auscultation. Abdomen is symmetrical. Bowel sounds are normal. On palpation abdomen is soft with mild tenderness  in hypogastric region and RLQ. No organomegaly or masses.  No LE edema or clubbing noted.   Assessment:  #1. 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 If symptoms not controlled with therapy she may need flexible sigmoidoscopy at some point. #2. Hematochezia most likely secondary to hemorrhoids. She may need diagnostic flexible sigmoidoscopy if she has further  episodes.   Plan:  Metamucil half to 1 pack by mouth daily at bedtime. Imodium OTC 1 mg by mouth every morning. Patient will keep stool diary as to frequency and consistency of stools for the next 6 weeks and call with progress report. She will also keep symptom diary as to frequency of bleeding episodes until next visit in 3 months. She will try to obtain colonoscopy records from 20 half years ago.

## 2016-05-15 ENCOUNTER — Telehealth (INDEPENDENT_AMBULATORY_CARE_PROVIDER_SITE_OTHER): Payer: Self-pay | Admitting: *Deleted

## 2016-05-15 NOTE — Telephone Encounter (Signed)
Patient has called in stating that she had few days ago several bowel movements that were just black as tar.  She wondered if this was backed up blood.  She is regular now.  She said she had her doctor there to check this and her labs she states were + and he checked her stool which was + for blood.  He doesn't know what to do.  She is concerned and wonders if there was something from CT she needed to be worried about when she was in the hospital.  She was last seen 05/01/2016 by you and she wonders if you and Dr. Laural Golden have a suggestion on what she needs to do.  She is concerned.  [phone]

## 2016-05-16 NOTE — Telephone Encounter (Signed)
I advised patient to have her PCP ? Dr. Chauncey Reading to send blood work that she had drawn to our office and I will send this to Dr. Laural Golden.

## 2016-05-17 NOTE — Telephone Encounter (Signed)
We should do Hemoccult. If Hemoccult is positive we will proceed with colonoscopy. Please call patient.

## 2016-05-18 NOTE — Telephone Encounter (Signed)
Talked with the patient and gave her Dr.Rehman's recommendation. She states that she has not had anymore, stools are normal. She will collect the specimen and bring it to [place] to be checked.

## 2016-08-07 ENCOUNTER — Encounter (INDEPENDENT_AMBULATORY_CARE_PROVIDER_SITE_OTHER): Payer: Self-pay | Admitting: Internal Medicine

## 2016-08-07 ENCOUNTER — Ambulatory Visit (INDEPENDENT_AMBULATORY_CARE_PROVIDER_SITE_OTHER): Payer: Medicare Other | Admitting: Internal Medicine

## 2016-08-07 VITALS — BP 130/70 | HR 76 | Temp 98.4°F | Resp 18 | Ht 62.0 in | Wt 169.0 lb

## 2016-08-07 DIAGNOSIS — R195 Other fecal abnormalities: Secondary | ICD-10-CM

## 2016-08-07 DIAGNOSIS — R197 Diarrhea, unspecified: Secondary | ICD-10-CM

## 2016-08-07 NOTE — Patient Instructions (Addendum)
Take Imodium or loperamide 1 mg by mouth every morning. Keep stool diary as to frequency and consistency of stools for the next one month and send [place] the summary. Hemoccult 1. Dulcolax suppository 1 per rectum on as-needed basis. Do not take OTC laxative by mouth.

## 2016-08-07 NOTE — Progress Notes (Signed)
Presenting complaint;  Follow-up for diarrhea.  Database and Subjective:  Patient is 82 year old Caucasian female who was admitted to [facility] about 4 months ago with acute illness with bloody diarrhea. CT revealed changes of colitis. Stool studies were negative. She isn't medically treated with Cipro and metronidazole and symptomatically improved. She was felt to have postinfectious diarrhea and was last seen 3 months ago.  She does not feel better. She continues to have spells of diarrhea. She had diarrhea over the weekend lasted for more than 12 hours and she had multiple bowel movements. She states she has diarrhea at least 3 days a week. Other days she has normal stools or she is constipated. When she is having diarrhea she has cramping that is relieved with bowel movements. She has not passed any blood per rectum. She was noted by her PCP to have heme-positive stool. She continues to have accidents. She says she's had 3 accidents since her last visit and she is using depends. Her appetite is good and she has not lost any weight. She had stool studies last month and these were negative. These are reviewed under lab data. She denies nausea vomiting fever or chills.    Current Medications: Outpatient Encounter Prescriptions as of 08/07/2016  Medication Sig  . acetaminophen (TYLENOL) 500 MG tablet Take 500 mg by mouth as needed.  Marland Kitchen acidophilus (RISAQUAD) CAPS capsule Take 1 capsule by mouth daily.  Marland Kitchen ALPRAZolam (XANAX) 1 MG tablet Take 1 mg by mouth 2 (two) times daily.  Marland Kitchen aspirin EC 81 MG tablet Take 81 mg by mouth every morning.  Marland Kitchen atorvastatin (LIPITOR) 10 MG tablet Take 10 mg by mouth every morning.  . cholecalciferol (VITAMIN D) 1000 UNITS tablet Take 5,000 Units by mouth daily. Reported on 05/01/2016  . diltiazem (DILACOR XR) 240 MG 24 hr capsule Take 240 mg by mouth daily.  . DULoxetine (CYMBALTA) 60 MG capsule Take 60 mg by mouth every morning.   Marland Kitchen lisinopril (PRINIVIL,ZESTRIL) 5 MG tablet  Take 5 mg by mouth every morning.  . metFORMIN (GLUCOPHAGE) 500 MG tablet Take 500 mg by mouth daily with breakfast.   . Multiple Vitamins-Minerals (MULTIVITAMINS THER. W/MINERALS) TABS tablet Take 1 tablet by mouth daily.  . Omega-3 Fatty Acids (FISH OIL PO) Take 1 capsule by mouth daily. Reported on 05/01/2016  . pantoprazole (PROTONIX) 40 MG tablet Take 40 mg by mouth every morning.  . promethazine (PHENERGAN) 25 MG tablet Take 25 mg by mouth every 6 (six) hours as needed for nausea or vomiting.  . psyllium (METAMUCIL SMOOTH TEXTURE) 28 % packet Take 1 packet by mouth at bedtime.  . Cyanocobalamin (VITAMIN B-12 PO) Take 1 tablet by mouth daily.  Marland Kitchen loperamide (IMODIUM A-D) 2 MG tablet Take 0.5 tablets (1 mg total) by mouth daily. (Patient not taking: Reported on 08/07/2016)  . [DISCONTINUED] metroNIDAZOLE (FLAGYL) 500 MG tablet Take 1 tablet (500 mg total) by mouth every 8 (eight) hours. (Patient not taking: Reported on 05/01/2016)   No facility-administered encounter medications on file as of 08/07/2016.      Objective: Blood pressure 130/70, pulse 76, temperature 98.4 F (36.9 C), temperature source Oral, resp. rate 18, height 5\' 2"  (1.575 m), weight 169 lb (76.7 kg). Patient is alert and in no acute distress. Conjunctiva is pink. Sclera is nonicteric Oropharyngeal mucosa is normal. No neck masses or thyromegaly noted. Cardiac exam with regular rhythm normal S1 and S2. No murmur or gallop noted. Lungs are clear to auscultation. Abdomen is symmetrical.  Bowel sounds are normal. On palpation abdomen is soft. She has mild tenderness across lower abdomen without guarding. No organomegaly or masses. No LE edema or clubbing noted.  Labs/studies Results: Lab data from 05/10/2016  WBC 9.9, H&H 12.3 and 39.4 and platelet count 300K  BUN 17, creatinine 0.65  Serum calcium 10.0  Bilirubin 0.4, AP 73, AST 27, ALT 21, total protein 7.4 and albumin 4.3.   Stool studies from 07/24/2016 Stool C.  difficile negative Stool O&P negative  Stool cultures negative    Assessment:  #1. Diarrhea. Patient was hospitalized 4 months ago with acute colitis felt to be an infection. Stool studies are negative. She was empirically treated with antibiotics. She remains with intermittent diarrhea along with constipation. She is suspected to have postinfectious IBS. If diarrhea persists she may need endoscopic evaluation. #2. Heme positive stool noted by PCP. H&H is normal. Last colonoscopy was in October 2009 and she has been reluctant to undergo another exam. Will repeat another Hemoccult before making final decision.   Plan:  Patient advised to take loperamide 1 mg by mouth every morning. Hemoccult 1 Patient advised not to take OTC laxatives it can use Dulcolax suppository on as-needed basis or when she goes 2 days without a bowel movement. She will keep stool diary for the next 4 weeks and send [place] the summary. Office visit in 3 months.

## 2016-08-22 ENCOUNTER — Encounter (INDEPENDENT_AMBULATORY_CARE_PROVIDER_SITE_OTHER): Payer: Self-pay

## 2016-11-27 ENCOUNTER — Ambulatory Visit (INDEPENDENT_AMBULATORY_CARE_PROVIDER_SITE_OTHER): Payer: Medicare Other | Admitting: Internal Medicine

## 2017-05-21 ENCOUNTER — Ambulatory Visit (INDEPENDENT_AMBULATORY_CARE_PROVIDER_SITE_OTHER): Payer: Medicare Other | Admitting: Internal Medicine

## 2017-05-21 ENCOUNTER — Encounter (INDEPENDENT_AMBULATORY_CARE_PROVIDER_SITE_OTHER): Payer: Self-pay

## 2017-05-21 ENCOUNTER — Encounter (INDEPENDENT_AMBULATORY_CARE_PROVIDER_SITE_OTHER): Payer: Self-pay | Admitting: Internal Medicine

## 2017-05-21 VITALS — BP 142/60 | HR 60 | Temp 97.4°F | Ht 62.0 in | Wt 167.3 lb

## 2017-05-21 DIAGNOSIS — A09 Infectious gastroenteritis and colitis, unspecified: Secondary | ICD-10-CM

## 2017-05-21 NOTE — Patient Instructions (Addendum)
GI pathogen.  Fiber 4 gms po

## 2017-05-21 NOTE — Progress Notes (Signed)
Subjective:    Patient ID: Jodi Webb, female    DOB: 01-17-1932, 83 y.o.   MRN: [id number]  HPI Her today for f/u. She was last seen by Dr. Laural Golden in August of 2017. She was seen by him for spells of diarrhea. Stool studies were negative. CT in 2017 revealed 1. Thickening of the walls of a long segment of the descending and sigmoid colon, compatible with acute colitis of infectious, inflammatory or ischemic nature. There is associated paracolic inflammation. No abscess collection. No evidence of bowel perforation. No associated bowel obstruction       .  Her last colonoscopy was in 2014 by Dr Charolette Child.  Colonoscopy was in 2009   which revealed scattered diverticula at sigmoid and descending colon. Small external hemorrhoids.  Presents today stating she has colitis. She says the diarrhea is so bad that she is scared to go out. She says the Imodium constipates. She tells me her stools are very loose. They are like water.  She is having 6-8 times a day which is normal for her . Symptoms for years. She has not been on any recent antibiotics.  She was in [place] in February of this year and her hemoglobin was 6. She received 2 units of blood.    Review of Systems Past Medical History:  Diagnosis Date  . Chronic diarrhea   . Colitis, infectious 03/31/2016  . Diabetes mellitus without complication ([place])   . Diverticulosis   . GERD (gastroesophageal reflux disease)   . Hyperlipemia   . Hypertension   . IBS (irritable bowel syndrome)   . Renal disorder    kidney stones    Past Surgical History:  Procedure Laterality Date  . ABDOMINAL HYSTERECTOMY    . BACK SURGERY    . CHOLECYSTECTOMY    . COLONOSCOPY     2/1/2 year ago [facility] , Dr.O'Neal  . kidney stents    . UPPER GASTROINTESTINAL ENDOSCOPY     2/1/2 year ago , [place], Dr.O'Neal.    No Known Allergies  Current Outpatient Prescriptions on File Prior to Visit  Medication Sig Dispense Refill  .  acetaminophen (TYLENOL) 500 MG tablet Take 500 mg by mouth as needed.    Marland Kitchen acidophilus (RISAQUAD) CAPS capsule Take 1 capsule by mouth daily.    Marland Kitchen ALPRAZolam (XANAX) 1 MG tablet Take 1 mg by mouth 2 (two) times daily.    Marland Kitchen aspirin EC 81 MG tablet Take 81 mg by mouth every morning.    Marland Kitchen atorvastatin (LIPITOR) 10 MG tablet Take 10 mg by mouth every morning.    . diltiazem (DILACOR XR) 240 MG 24 hr capsule Take 240 mg by mouth daily.    . DULoxetine (CYMBALTA) 60 MG capsule Take 60 mg by mouth every morning.     Marland Kitchen lisinopril (PRINIVIL,ZESTRIL) 5 MG tablet Take 5 mg by mouth every morning.    . loperamide (IMODIUM A-D) 2 MG tablet Take 0.5 tablets (1 mg total) by mouth daily. 30 tablet 0  . Omega-3 Fatty Acids (FISH OIL PO) Take 1 capsule by mouth daily. Reported on 05/01/2016    . pantoprazole (PROTONIX) 40 MG tablet Take 40 mg by mouth every morning.    . promethazine (PHENERGAN) 25 MG tablet Take 25 mg by mouth every 6 (six) hours as needed for nausea or vomiting.    . cholecalciferol (VITAMIN D) 1000 UNITS tablet Take 5,000 Units by mouth daily. Reported on 05/01/2016    . Cyanocobalamin (VITAMIN  B-12 PO) Take 1 tablet by mouth daily.    . metFORMIN (GLUCOPHAGE) 500 MG tablet Take 500 mg by mouth daily with breakfast.     . Multiple Vitamins-Minerals (MULTIVITAMINS THER. W/MINERALS) TABS tablet Take 1 tablet by mouth daily.    . psyllium (METAMUCIL SMOOTH TEXTURE) 28 % packet Take 1 packet by mouth at bedtime. (Patient not taking: Reported on 05/21/2017)     No current facility-administered medications on file prior to visit.        Objective:   Physical Exam Blood pressure (!) 142/60, pulse 60, temperature 97.4 F (36.3 C), height 5\' 2"  (1.575 m), weight 167 lb 4.8 oz (75.9 kg).  Alert and oriented. Skin warm and dry. Oral mucosa is moist.   . Sclera anicteric, conjunctivae is pink. Thyroid not enlarged. No cervical lymphadenopathy. Lungs clear. Heart regular rate and rhythm.  Abdomen is soft.  Bowel sounds are positive. No hepatomegaly. No abdominal masses felt. No tenderness.  No edema to lower extremities   Stool brown and guaiac negative.        Assessment & Plan:  Chronic diarrhea. Fiber 4gm po. Fecal lactoferr Stool studies. Dr. Laural Golden in with patient also.

## 2017-09-18 ENCOUNTER — Encounter (INDEPENDENT_AMBULATORY_CARE_PROVIDER_SITE_OTHER): Payer: Self-pay

## 2017-09-18 ENCOUNTER — Ambulatory Visit (INDEPENDENT_AMBULATORY_CARE_PROVIDER_SITE_OTHER): Payer: Medicare Other | Admitting: Internal Medicine

## 2017-09-18 ENCOUNTER — Encounter (INDEPENDENT_AMBULATORY_CARE_PROVIDER_SITE_OTHER): Payer: Self-pay | Admitting: Internal Medicine

## 2017-09-18 VITALS — BP 110/64 | HR 56 | Temp 98.1°F | Ht 62.0 in | Wt 169.9 lb

## 2017-09-18 DIAGNOSIS — R197 Diarrhea, unspecified: Secondary | ICD-10-CM

## 2017-09-18 NOTE — Progress Notes (Signed)
Subjective:    Patient ID: Jodi Webb, female    DOB: 1932-04-14, 83 y.o.   MRN: [id number]  HPI Here today for f/u. Last seen in [place] by me.  I never received results on stools studies. She says she turned to stool sample in to Dr. Chauncey Reading but apparently was lost. She says as soon as she eats, she has to go to the bathroom. She says she urgency and diarrhea has been going on for used.  She is taking anti-diarrheal . She says at times the antidiarrheal will constipate her. She is having on average 4-5 stools a day.  Her appetite is good. No weight loss.     Last colonoscopy in 2014 by Dr. Leonides Schanz per patient was normal.    colonoscopy was in 2009 change in stool which revealed Scattered diverticula at sigmoid and descending colon. Small external hemorrhoids.  Review of Systems Past Medical History:  Diagnosis Date  . Chronic diarrhea   . Colitis, infectious 03/31/2016  . Diabetes mellitus without complication ([employer])   . Diverticulosis   . GERD (gastroesophageal reflux disease)   . Hyperlipemia   . Hypertension   . IBS (irritable bowel syndrome)   . Renal disorder    kidney stones    Past Surgical History:  Procedure Laterality Date  . ABDOMINAL HYSTERECTOMY    . BACK SURGERY    . CHOLECYSTECTOMY    . COLONOSCOPY     2/1/2 year ago [facility] [place] , Dr.O'Neal  . kidney stents    . UPPER GASTROINTESTINAL ENDOSCOPY     2/1/2 year ago , [place], Dr.O'Neal.    No Known Allergies  Current Outpatient Prescriptions on File Prior to Visit  Medication Sig Dispense Refill  . acetaminophen (TYLENOL) 500 MG tablet Take 500 mg by mouth as needed.    Marland Kitchen acidophilus (RISAQUAD) CAPS capsule Take 1 capsule by mouth daily.    Marland Kitchen ALPRAZolam (XANAX) 1 MG tablet Take 1 mg by mouth 2 (two) times daily.    Marland Kitchen aspirin EC 81 MG tablet Take 81 mg by mouth every morning.    Marland Kitchen atorvastatin (LIPITOR) 10 MG tablet Take 10 mg by mouth every morning.    . cholecalciferol (VITAMIN D) 1000  UNITS tablet Take 5,000 Units by mouth daily. Reported on 05/01/2016    . Cyanocobalamin (VITAMIN B-12 PO) Take 1 tablet by mouth daily.    Marland Kitchen diltiazem (DILACOR XR) 240 MG 24 hr capsule Take 240 mg by mouth daily.    . DULoxetine (CYMBALTA) 60 MG capsule Take 60 mg by mouth every morning.     Marland Kitchen lisinopril (PRINIVIL,ZESTRIL) 5 MG tablet Take 5 mg by mouth every morning.    . Multiple Vitamins-Minerals (MULTIVITAMINS THER. W/MINERALS) TABS tablet Take 1 tablet by mouth daily.    . Omega-3 Fatty Acids (FISH OIL PO) Take 1 capsule by mouth daily. Reported on 05/01/2016    . pantoprazole (PROTONIX) 40 MG tablet Take 40 mg by mouth every morning.    . promethazine (PHENERGAN) 25 MG tablet Take 25 mg by mouth every 6 (six) hours as needed for nausea or vomiting.    . psyllium (METAMUCIL SMOOTH TEXTURE) 28 % packet Take 1 packet by mouth at bedtime.     No current facility-administered medications on file prior to visit.         Objective:   Physical Exam Blood pressure 110/64, pulse (!) 56, temperature 98.1 F (36.7 C), height 5\' 2"  (1.575 m), weight 169 lb  14.4 oz (77.1 kg). Alert and oriented. Skin warm and dry. Oral mucosa is moist.   . Sclera anicteric, conjunctivae is pink. Thyroid not enlarged. No cervical lymphadenopathy. Lungs clear. Heart regular rate and rhythm.  Abdomen is soft. Bowel sounds are positive. No hepatomegaly. No abdominal masses felt. No tenderness.  No edema to lower extremities.          Assessment & Plan:  Diarrhea. Urgency.  Continue the Imodium as needed. Take Fiber 4 gms a day.   Will get a GI pathogen,.

## 2017-09-18 NOTE — Patient Instructions (Signed)
GI pathogen. Continue the imodium as needed Continue the Fiber.

## 2018-02-17 ENCOUNTER — Telehealth (INDEPENDENT_AMBULATORY_CARE_PROVIDER_SITE_OTHER): Payer: Self-pay | Admitting: *Deleted

## 2018-02-17 NOTE — Telephone Encounter (Signed)
Patient left message stating she is having trouble with her bowels everything she eats goes right through her, patient thinks it may be her colitis. Patient states she is having nausea. Please advise [phone]

## 2018-02-17 NOTE — Telephone Encounter (Signed)
Possible fever yesterday. Some diarrhea. No fever diarrhea. I advised her to to take one Imodium a day. Hx of chronic diarrhea. She will call me with a PR end of weeks.

## 2018-04-02 ENCOUNTER — Ambulatory Visit (INDEPENDENT_AMBULATORY_CARE_PROVIDER_SITE_OTHER): Payer: Medicare Other | Admitting: Internal Medicine

## 2018-04-08 ENCOUNTER — Encounter (INDEPENDENT_AMBULATORY_CARE_PROVIDER_SITE_OTHER): Payer: Self-pay | Admitting: Internal Medicine

## 2018-04-08 ENCOUNTER — Ambulatory Visit (INDEPENDENT_AMBULATORY_CARE_PROVIDER_SITE_OTHER): Payer: Medicare Other | Admitting: Internal Medicine

## 2018-04-08 VITALS — BP 160/80 | HR 72 | Temp 98.4°F | Ht 62.0 in | Wt 174.8 lb

## 2018-04-08 DIAGNOSIS — R197 Diarrhea, unspecified: Secondary | ICD-10-CM | POA: Diagnosis not present

## 2018-04-08 NOTE — Patient Instructions (Addendum)
iIrritable Bowel Syndrome, Adult Irritable bowel syndrome (IBS) is not one specific disease. It is a group of symptoms that affects the organs responsible for digestion (gastrointestinal or GI tract). To regulate how your GI tract works, your body sends signals back and forth between your intestines and your brain. If you have IBS, there may be a problem with these signals. As a result, your GI tract does not function normally. Your intestines may become more sensitive and overreact to certain things. This is especially true when you eat certain foods or when you are under stress. There are four types of IBS. These may be determined based on the consistency of your stool:  IBS with diarrhea.  IBS with constipation.  Mixed IBS.  Unsubtyped IBS.  It is important to know which type of IBS you have. Some treatments are more likely to be helpful for certain types of IBS. What are the causes? The exact cause of IBS is not known. What increases the risk? You may have a higher risk of IBS if:  You are a woman.  You are younger than 82 years old.  You have a family history of IBS.  You have mental health problems.  You have had bacterial infection of your GI tract.  What are the signs or symptoms? Symptoms of IBS vary from person to person. The main symptom is abdominal pain or discomfort. Additional symptoms usually include one or more of the following:  Diarrhea, constipation, or both.  Abdominal swelling or bloating.  Feeling full or sick after eating a small or regular-size meal.  Frequent gas.  Mucus in the stool.  A feeling of having more stool left after a bowel movement.  Symptoms tend to come and go. They may be associated with stress, psychiatric conditions, or nothing at all. How is this diagnosed? There is no specific test to diagnose IBS. Your health care provider will make a diagnosis based on a physical exam, medical history, and your symptoms. You may have other  tests to rule out other conditions that may be causing your symptoms. These may include:  Blood tests.  X-rays.  CT scan.  Endoscopy and colonoscopy. This is a test in which your GI tract is viewed with a long, thin, flexible tube.  How is this treated? There is no cure for IBS, but treatment can help relieve symptoms. IBS treatment often includes:  Changes to your diet, such as: ? Eating more fiber. ? Avoiding foods that cause symptoms. ? Drinking more water. ? Eating regular, medium-sized portioned meals.  Medicines. These may include: ? Fiber supplements if you have constipation. ? Medicine to control diarrhea (antidiarrheal medicines). ? Medicine to help control muscle spasms in your GI tract (antispasmodic medicines). ? Medicines to help with any mental health issues, such as antidepressants or tranquilizers.  Therapy. ? Talk therapy may help with anxiety, depression, or other mental health issues that can make IBS symptoms worse.  Stress reduction. ? Managing your stress can help keep symptoms under control.  Follow these instructions at home:  Take medicines only as directed by your health care provider.  Eat a healthy diet. ? Avoid foods and drinks with added sugar. ? Include more whole grains, fruits, and vegetables gradually into your diet. This may be especially helpful if you have IBS with constipation. ? Avoid any foods and drinks that make your symptoms worse. These may include dairy products and caffeinated or carbonated drinks. ? Do not eat large meals. ? Drink enough  fluid to keep your urine clear or pale yellow.  Exercise regularly. Ask your health care provider for recommendations of good activities for you.  Keep all follow-up visits as directed by your health care provider. This is important. Contact a health care provider if:  You have constant pain.  You have trouble or pain with swallowing.  You have worsening diarrhea. Get help right away  if:  You have severe and worsening abdominal pain.  You have diarrhea and: ? You have a rash, stiff neck, or severe headache. ? You are irritable, sleepy, or difficult to awaken. ? You are weak, dizzy, or extremely thirsty.  You have bright red blood in your stool or you have black tarry stools.  You have unusual abdominal swelling that is painful.  You vomit continuously.  You vomit blood (hematemesis).  You have both abdominal pain and a fever. This information is not intended to replace advice given to you by your health care provider. Make sure you discuss any questions you have with your health care provider. Document Released: 12/17/2005 Document Revised: 05/18/2016 Document Reviewed: 09/03/2014 Elsevier Interactive Patient Education  2018 [place]. Irritable Bowel Syndrome, Adult Irritable bowel syndrome (IBS) is not one specific disease. It is a group of symptoms that affects the organs responsible for digestion (gastrointestinal or GI tract). To regulate how your GI tract works, your body sends signals back and forth between your intestines and your brain. If you have IBS, there may be a problem with these signals. As a result, your GI tract does not function normally. Your intestines may become more sensitive and overreact to certain things. This is especially true when you eat certain foods or when you are under stress. There are four types of IBS. These may be determined based on the consistency of your stool:  IBS with diarrhea.  IBS with constipation.  Mixed IBS.  Unsubtyped IBS.  It is important to know which type of IBS you have. Some treatments are more likely to be helpful for certain types of IBS. What are the causes? The exact cause of IBS is not known. What increases the risk? You may have a higher risk of IBS if:  You are a woman.  You are younger than 82 years old.  You have a family history of IBS.  You have mental health problems.  You  have had bacterial infection of your GI tract.  What are the signs or symptoms? Symptoms of IBS vary from person to person. The main symptom is abdominal pain or discomfort. Additional symptoms usually include one or more of the following:  Diarrhea, constipation, or both.  Abdominal swelling or bloating.  Feeling full or sick after eating a small or regular-size meal.  Frequent gas.  Mucus in the stool.  A feeling of having more stool left after a bowel movement.  Symptoms tend to come and go. They may be associated with stress, psychiatric conditions, or nothing at all. How is this diagnosed? There is no specific test to diagnose IBS. Your health care provider will make a diagnosis based on a physical exam, medical history, and your symptoms. You may have other tests to rule out other conditions that may be causing your symptoms. These may include:  Blood tests.  X-rays.  CT scan.  Endoscopy and colonoscopy. This is a test in which your GI tract is viewed with a long, thin, flexible tube.  How is this treated? There is no cure for IBS, but treatment can  help relieve symptoms. IBS treatment often includes:  Changes to your diet, such as: ? Eating more fiber. ? Avoiding foods that cause symptoms. ? Drinking more water. ? Eating regular, medium-sized portioned meals.  Medicines. These may include: ? Fiber supplements if you have constipation. ? Medicine to control diarrhea (antidiarrheal medicines). ? Medicine to help control muscle spasms in your GI tract (antispasmodic medicines). ? Medicines to help with any mental health issues, such as antidepressants or tranquilizers.  Therapy. ? Talk therapy may help with anxiety, depression, or other mental health issues that can make IBS symptoms worse.  Stress reduction. ? Managing your stress can help keep symptoms under control.  Follow these instructions at home:  Take medicines only as directed by your health care  provider.  Eat a healthy diet. ? Avoid foods and drinks with added sugar. ? Include more whole grains, fruits, and vegetables gradually into your diet. This may be especially helpful if you have IBS with constipation. ? Avoid any foods and drinks that make your symptoms worse. These may include dairy products and caffeinated or carbonated drinks. ? Do not eat large meals. ? Drink enough fluid to keep your urine clear or pale yellow.  Exercise regularly. Ask your health care provider for recommendations of good activities for you.  Keep all follow-up visits as directed by your health care provider. This is important. Contact a health care provider if:  You have constant pain.  You have trouble or pain with swallowing.  You have worsening diarrhea. Get help right away if:  You have severe and worsening abdominal pain.  You have diarrhea and: ? You have a rash, stiff neck, or severe headache. ? You are irritable, sleepy, or difficult to awaken. ? You are weak, dizzy, or extremely thirsty.  You have bright red blood in your stool or you have black tarry stools.  You have unusual abdominal swelling that is painful.  You vomit continuously.  You vomit blood (hematemesis).  You have both abdominal pain and a fever. This information is not intended to replace advice given to you by your health care provider. Make sure you discuss any questions you have with your health care provider. Document Released: 12/17/2005 Document Revised: 05/18/2016 Document Reviewed: 09/03/2014 Elsevier Interactive Patient Education  2018 [employer].

## 2018-04-08 NOTE — Progress Notes (Signed)
Subjective:    Patient ID: Jodi Webb, female    DOB: 12-05-1932, 84 y.o.   MRN: [id number]  HPI Presents today with c/o post prandial diarrhea. She has tried not drinking water. She tells me her BMs are very loose. She is having about 3 times a day.  She says she is scared to go out to eat. She says sometimes if she is driving, the urge to have a BM will hit her. When she has diarrhea she sometimes will take an Imodium which sometimes constipates her.  She was last seen in September of 2018.  Hx of diarrhea. At [place], GI pathogen ordered but she did not obtain it. Hx of IBS with diarrhea.  She has had IBS diarrhea for years.    Thee is a family hx of colon cancer in a sister in her 34s.   Last colonoscopy in 2014 by Dr. Leonides Schanz per patient was normal.    Colonoscopy was in October of 2009 (change in stool) which revealed scattered diverticula at sigmoid and descending colon. Small external hemorrhoids.   Review of Systems Past Medical History:  Diagnosis Date  . Chronic diarrhea   . Colitis, infectious 03/31/2016  . Diabetes mellitus without complication ([place])   . Diverticulosis   . GERD (gastroesophageal reflux disease)   . Hyperlipemia   . Hypertension   . IBS (irritable bowel syndrome)   . Renal disorder    kidney stones    Past Surgical History:  Procedure Laterality Date  . ABDOMINAL HYSTERECTOMY    . BACK SURGERY    . CHOLECYSTECTOMY    . COLONOSCOPY     2/1/2 year ago [facility] , Dr.O'Neal  . kidney stents    . UPPER GASTROINTESTINAL ENDOSCOPY     2/1/2 year ago , [facility] [address] [facility], Dr.O'Neal.    No Known Allergies  Current Outpatient Medications on File Prior to Visit  Medication Sig Dispense Refill  . acetaminophen (TYLENOL) 500 MG tablet Take 500 mg by mouth as needed.    Marland Kitchen acidophilus (RISAQUAD) CAPS capsule Take 1 capsule by mouth daily.    Marland Kitchen ALPRAZolam (XANAX) 1 MG tablet Take 1 mg by mouth 2 (two) times daily.    Marland Kitchen aspirin EC 81 MG tablet Take  81 mg by mouth every morning.    Marland Kitchen atorvastatin (LIPITOR) 10 MG tablet Take 10 mg by mouth every morning.    . cholecalciferol (VITAMIN D) 1000 UNITS tablet Take 5,000 Units by mouth daily. Reported on 05/01/2016    . Cyanocobalamin (VITAMIN B-12 PO) Take 1 tablet by mouth daily.    Marland Kitchen diltiazem (DILACOR XR) 240 MG 24 hr capsule Take 240 mg by mouth daily.    . DULoxetine (CYMBALTA) 60 MG capsule Take 60 mg by mouth every morning.     Marland Kitchen lisinopril (PRINIVIL,ZESTRIL) 5 MG tablet Take 5 mg by mouth every morning.    . Multiple Vitamins-Minerals (MULTIVITAMINS THER. W/MINERALS) TABS tablet Take 1 tablet by mouth daily.    . Omega-3 Fatty Acids (FISH OIL PO) Take 1 capsule by mouth daily. Reported on 05/01/2016    . pantoprazole (PROTONIX) 40 MG tablet Take 40 mg by mouth every morning.    . psyllium (METAMUCIL SMOOTH TEXTURE) 28 % packet Take 1 packet by mouth at bedtime.     No current facility-administered medications on file prior to visit.         Objective:   Physical Exam Blood pressure (!) 160/80, pulse 72, temperature 98.4 F (  36.9 C), height 5\' 2"  (1.575 m), weight 174 lb 12.8 oz (79.3 kg).  Alert and oriented. Skin warm and dry. Oral mucosa is moist.   . Sclera anicteric, conjunctivae is pink. Thyroid not enlarged. No cervical lymphadenopathy. Lungs clear. Heart regular rate and rhythm.  Abdomen is soft. Bowel sounds are positive. No hepatomegaly. No abdominal masses felt. No tenderness.  No edema to lower extremities.         Assessment & Plan:  Diarrhea. She will take one Imodium daily. She can skip if she becomes constipated.  Increase fiber in diet. Try Fiber bars daily. Stool diary for 2 weeks.

## 2018-05-29 LAB — GASTROINTESTINAL PATHOGEN PANEL PCR
C. DIFFICILE TOX A/B, PCR: NOT DETECTED
Campylobacter, PCR: NOT DETECTED
Cryptosporidium, PCR: NOT DETECTED
E COLI (STEC) STX1/STX2, PCR: NOT DETECTED
E COLI 0157, PCR: NOT DETECTED
E coli (ETEC) LT/ST PCR: NOT DETECTED
Giardia lamblia, PCR: NOT DETECTED
Norovirus, PCR: NOT DETECTED
Rotavirus A, PCR: NOT DETECTED
SALMONELLA, PCR: NOT DETECTED
Shigella, PCR: NOT DETECTED

## 2018-07-24 ENCOUNTER — Encounter (INDEPENDENT_AMBULATORY_CARE_PROVIDER_SITE_OTHER): Payer: Self-pay | Admitting: *Deleted

## 2018-08-19 ENCOUNTER — Ambulatory Visit (INDEPENDENT_AMBULATORY_CARE_PROVIDER_SITE_OTHER): Payer: Medicare Other | Admitting: Internal Medicine

## 2018-12-02 ENCOUNTER — Encounter (INDEPENDENT_AMBULATORY_CARE_PROVIDER_SITE_OTHER): Payer: Self-pay | Admitting: Internal Medicine

## 2018-12-02 ENCOUNTER — Ambulatory Visit (INDEPENDENT_AMBULATORY_CARE_PROVIDER_SITE_OTHER): Payer: Medicare PPO | Admitting: Internal Medicine

## 2018-12-02 VITALS — BP 138/90 | HR 68 | Temp 98.8°F

## 2018-12-02 DIAGNOSIS — K449 Diaphragmatic hernia without obstruction or gangrene: Secondary | ICD-10-CM

## 2018-12-02 DIAGNOSIS — K580 Irritable bowel syndrome with diarrhea: Secondary | ICD-10-CM

## 2018-12-02 MED ORDER — LOPERAMIDE HCL 2 MG PO CAPS: 2.0000 mg | ORAL_CAPSULE | Freq: Every day | ORAL | Status: DC | PRN

## 2018-12-02 MED ORDER — BISACODYL 10 MG RE SUPP: 10.0000 mg | RECTAL | 0 refills | Status: DC | PRN

## 2018-12-02 NOTE — Progress Notes (Signed)
Presenting complaint;  Follow-up for IBS   Subjective:  Patient is 84 year old Caucasian female who has a history of diarrhea due to IBS as well as GERD and known large hiatal hernia who is here for scheduled visit.  She was last seen in our office by Ms. Setzer NP in April 2019 and by me in August 2017.  She remains with intermittent diarrhea.  She uses Imodium sparingly because it makes her constipated and she does not feel well when she is constipated.  She remains under a lot of stress.  She feels her depression is not well controlled.  She is on medication for it.  Over the last few years she has lost several family members.  She lost her twin sister in Mar 28, 2013 her husband of renal carcinoma in March 28, 2014 and another sister in 2013-03-28 and her son died few years ago because of primary liver cancer.  He says he was 82 years old.  He was 82 years old. Her appetite is not normal but getting better.  Her weight is down by 2 pounds since her last office visit of April this year.  She denies melena or rectal bleeding.  She says heartburn is well controlled with PPI.  She was found to have a large hiatal hernia when she had abdominopelvic CT back in April 2017. She is interested in having colonoscopy.  Her last exam was about 5 years ago in [place].  She has never had any polyps. Her sister had colon carcinoma but she was in her late 52s when she was diagnosed. She lives alone but her daughter lives few minutes away and others to children live close by as well.   Current Medications: Outpatient Encounter Medications as of 12/02/2018  Medication Sig  . acetaminophen (TYLENOL) 500 MG tablet Take 500 mg by mouth as needed.  Marland Kitchen acidophilus (RISAQUAD) CAPS capsule Take 1 capsule by mouth daily.  Marland Kitchen ALPRAZolam (XANAX) 1 MG tablet Take 1 mg by mouth 2 (two) times daily.  Marland Kitchen aspirin EC 81 MG tablet Take 81 mg by mouth every morning.  Marland Kitchen atorvastatin (LIPITOR) 10 MG tablet Take 10 mg by mouth every morning.  .  cholecalciferol (VITAMIN D) 1000 UNITS tablet Take 5,000 Units by mouth daily. Reported on 05/01/2016  . Cyanocobalamin (VITAMIN B-12 PO) Take 1 tablet by mouth daily.  Marland Kitchen diltiazem (DILACOR XR) 240 MG 24 hr capsule Take 240 mg by mouth daily.  . DULoxetine (CYMBALTA) 60 MG capsule Take 60 mg by mouth every morning.   Marland Kitchen lisinopril (PRINIVIL,ZESTRIL) 5 MG tablet Take 5 mg by mouth every morning.  . Multiple Vitamins-Minerals (MULTIVITAMINS THER. W/MINERALS) TABS tablet Take 1 tablet by mouth daily.  . Omega-3 Fatty Acids (FISH OIL PO) Take 1 capsule by mouth daily. Reported on 05/01/2016  . pantoprazole (PROTONIX) 40 MG tablet Take 40 mg by mouth every morning.  . [DISCONTINUED] loperamide (IMODIUM) 2 MG capsule Take by mouth as needed for diarrhea or loose stools.  . [DISCONTINUED] psyllium (METAMUCIL SMOOTH TEXTURE) 28 % packet Take 1 packet by mouth at bedtime. (Patient not taking: Reported on 12/02/2018)   No facility-administered encounter medications on file as of 12/02/2018.      Objective: Blood pressure 138/90, pulse 68, temperature 98.8 F (37.1 C), temperature source Oral.  Weight 172.7 pounds.  She is 62 inches tall. Patient is alert and in no acute distress. Conjunctiva is pink. Sclera is nonicteric Oropharyngeal mucosa is normal. No neck masses or thyromegaly noted. Cardiac exam with regular  rhythm normal S1 and S2. No murmur or gallop noted. Lungs are clear to auscultation. Abdomen is full but soft and nontender with organomegaly or masses. No LE edema or clubbing noted.   Assessment:  #1.  Postinfectious irritable bowel syndrome.  She has intermittent diarrhea which appears to be stress-induced.  She should use loperamide on days when she has to leave house so that she would not have accidents.  She can use Dulcolax suppository for constipation.  She needs to make sure she does not go more than 1 day without a bowel movement. She is average risk for CRC and I would not  recommend screening colonoscopy at age 89.  Should she develop rectal bleeding or worsening diarrhea may consider diagnostic examination.  #2.  GERD.  She has known large sliding hiatal hernia.  She is doing well with PPI and dietary measures.   Plan:  Patient encouraged to use loperamide OTC 2 mg daily as needed. She can use Dulcolax suppository for constipation or if she has an urge and unable to have a bowel movement. Raise HOB by 4 inches if possible as she is at risk for nocturnal regurgitation. Patient will call if her symptoms change or if she has rectal bleeding Office visit in 6 months.

## 2018-12-02 NOTE — Patient Instructions (Signed)
Can use Dulcolax suppository for constipation on as-needed basis. Elevate head end of bed by 4 inches if possible.

## 2019-06-02 ENCOUNTER — Ambulatory Visit (INDEPENDENT_AMBULATORY_CARE_PROVIDER_SITE_OTHER): Payer: Medicare Other | Admitting: Internal Medicine

## 2019-06-03 ENCOUNTER — Encounter (INDEPENDENT_AMBULATORY_CARE_PROVIDER_SITE_OTHER): Payer: Self-pay | Admitting: Internal Medicine

## 2019-11-18 ENCOUNTER — Encounter (INDEPENDENT_AMBULATORY_CARE_PROVIDER_SITE_OTHER): Payer: Medicare Other | Admitting: Ophthalmology

## 2019-11-18 DIAGNOSIS — H31422 Serous choroidal detachment, left eye: Secondary | ICD-10-CM | POA: Diagnosis not present

## 2019-11-18 DIAGNOSIS — D3131 Benign neoplasm of right choroid: Secondary | ICD-10-CM

## 2019-11-18 DIAGNOSIS — H35033 Hypertensive retinopathy, bilateral: Secondary | ICD-10-CM

## 2019-11-18 DIAGNOSIS — I10 Essential (primary) hypertension: Secondary | ICD-10-CM

## 2019-11-18 DIAGNOSIS — H43813 Vitreous degeneration, bilateral: Secondary | ICD-10-CM

## 2020-05-16 ENCOUNTER — Encounter (INDEPENDENT_AMBULATORY_CARE_PROVIDER_SITE_OTHER): Payer: Self-pay | Admitting: Gastroenterology

## 2020-07-06 ENCOUNTER — Ambulatory Visit (INDEPENDENT_AMBULATORY_CARE_PROVIDER_SITE_OTHER): Payer: Medicare Other | Admitting: Gastroenterology

## 2020-07-06 ENCOUNTER — Encounter (INDEPENDENT_AMBULATORY_CARE_PROVIDER_SITE_OTHER): Payer: Self-pay | Admitting: Gastroenterology

## 2020-07-06 ENCOUNTER — Other Ambulatory Visit: Payer: Self-pay

## 2020-07-06 VITALS — BP 149/78 | HR 81 | Temp 99.9°F | Ht 61.0 in | Wt 161.3 lb

## 2020-07-06 DIAGNOSIS — K580 Irritable bowel syndrome with diarrhea: Secondary | ICD-10-CM | POA: Diagnosis not present

## 2020-07-06 DIAGNOSIS — K219 Gastro-esophageal reflux disease without esophagitis: Secondary | ICD-10-CM

## 2020-07-06 MED ORDER — PANTOPRAZOLE SODIUM 40 MG PO TBEC: 40.0000 mg | DELAYED_RELEASE_TABLET | Freq: Every morning | ORAL | 3 refills | Status: DC

## 2020-07-06 MED ORDER — DICYCLOMINE HCL 10 MG PO CAPS: 10.0000 mg | ORAL_CAPSULE | Freq: Three times a day (TID) | ORAL | 3 refills | Status: DC | PRN

## 2020-07-06 NOTE — Progress Notes (Signed)
Patient profile: Jodi Webb is a 86 y.o. female seen for evaluation of dirrhea. Last seen in clinic on 11/2018 for post infectious diarrhea.    History of Present Illness: Jodi Webb is seen today for chronic diarrhea, biggest issue seems to be urgency after meals, this worsened approximately a month ago per report. Usually having about 4-5 Bm/day on average. Describes these as mostly liquid, occasionally soft. Denies any blood in stool or frequent abd pain.   She had an episode of incontinence last week at that grocery store. Describes frequent issues w/ incontience due to diarrhea and scared to go out to due to symptoms.  Denies alternating constipation unless she uses Imodium which is fairly infrequent.  She feels that her diarrhea worsened after her gallbladder surgery.  PCP notes report of being in the past on metformin but denies currently using. Chronic nausea, no vomiting. Some GERD symptoms w/ protonix once a day but overall feels this controls her symptoms well.  She denies alcohol, NSAIDs, etc.  Wt Readings from Last 3 Encounters:  07/06/20 161 lb 4.8 oz (73.2 kg)  04/08/18 174 lb 12.8 oz (79.3 kg)  09/18/17 169 lb 14.4 oz (77.1 kg)     Last Colonoscopy: 2009-done for constipation/diarrhea, external hemorrhoids, otherwise unremarkable.    Past Medical History:  Past Medical History:  Diagnosis Date  . Chronic diarrhea   . Colitis, infectious 03/31/2016  . Diabetes mellitus without complication ([place])   . Diverticulosis   . GERD (gastroesophageal reflux disease)   . Hyperlipemia   . Hypertension   . IBS (irritable bowel syndrome)   . Renal disorder    kidney stones    Problem List: Patient Active Problem List   Diagnosis Date Noted  . Respiratory failure with hypoxia ([place]) 04/02/2016  . Hematochezia 04/01/2016  . Acute blood loss anemia 04/01/2016  . Type 2 diabetes mellitus without complication ([place]) 44/31/5400  . Diverticulosis of colon 04/01/2016  .  Chronic UTI 04/01/2016  . Colitis, infectious 03/31/2016  . Diabetes mellitus without complication ([place])   . Essential hypertension   . Renal disorder   . Hyperlipemia   . IBS (irritable bowel syndrome)   . Diverticulosis   . GERD (gastroesophageal reflux disease)     Past Surgical History: Past Surgical History:  Procedure Laterality Date  . ABDOMINAL HYSTERECTOMY    . BACK SURGERY    . CHOLECYSTECTOMY    . COLONOSCOPY     2/1/2 year ago [facility] , Dr.O'Neal  . kidney stents    . UPPER GASTROINTESTINAL ENDOSCOPY     2/1/2 year ago , [place], Dr.O'Neal.    Allergies: Allergies  Allergen Reactions  . Penicillins Shortness Of Breath and Rash      Home Medications:  Current Outpatient Medications:  .  acetaminophen (TYLENOL) 500 MG tablet, Take 500 mg by mouth as needed., Disp: , Rfl:  .  ALPRAZolam (XANAX) 1 MG tablet, Take 1 mg by mouth 2 (two) times daily., Disp: , Rfl:  .  Ascorbic Acid (VITAMIN C) 1000 MG tablet, Take 1,000 mg by mouth daily., Disp: , Rfl:  .  aspirin EC 81 MG tablet, Take 81 mg by mouth every morning., Disp: , Rfl:  .  atorvastatin (LIPITOR) 10 MG tablet, Take 10 mg by mouth every morning., Disp: , Rfl:  .  cholecalciferol (VITAMIN D) 1000 UNITS tablet, Take 5,000 Units by mouth daily. Reported on 05/01/2016, Disp: , Rfl:  .  Cyanocobalamin (VITAMIN B-12 PO),  Take 1 tablet by mouth daily., Disp: , Rfl:  .  diltiazem (DILACOR XR) 240 MG 24 hr capsule, Take 240 mg by mouth daily., Disp: , Rfl:  .  DULoxetine (CYMBALTA) 60 MG capsule, Take 60 mg by mouth every morning. , Disp: , Rfl:  .  lisinopril (PRINIVIL,ZESTRIL) 5 MG tablet, Take 5 mg by mouth every morning., Disp: , Rfl:  .  loperamide (IMODIUM) 2 MG capsule, Take 1 capsule (2 mg total) by mouth daily as needed for diarrhea or loose stools., Disp: 30 capsule, Rfl:  .  Multiple Vitamins-Minerals (MULTIVITAMINS THER. W/MINERALS) TABS tablet, Take 1 tablet by mouth daily., Disp: , Rfl:  .   Omega-3 Fatty Acids (FISH OIL PO), Take 1 capsule by mouth daily. Reported on 05/01/2016, Disp: , Rfl:  .  OVER THE COUNTER MEDICATION, Mucous Relief - Takes 1 by mouth in the morning and the evening., Disp: , Rfl:  .  Zinc 30 MG CAPS, Take 30 mg by mouth., Disp: , Rfl:  .  bisacodyl (DULCOLAX) 10 MG suppository, Place 1 suppository (10 mg total) rectally as needed for moderate constipation. (Patient not taking: Reported on 07/06/2020), Disp: 12 suppository, Rfl: 0 .  dicyclomine (BENTYL) 10 MG capsule, Take 1 capsule (10 mg total) by mouth 3 (three) times daily as needed (diarrhea)., Disp: 30 capsule, Rfl: 3 .  pantoprazole (PROTONIX) 40 MG tablet, Take 1 tablet (40 mg total) by mouth every morning., Disp: 90 tablet, Rfl: 3   Family History: family history includes ALS in her sister; Arthritis in her sister; Cirrhosis in her daughter and father; Colon cancer in her sister; Dementia in her sister; Heart disease in her mother; Hip dysplasia in her sister; Ovarian cancer in her mother.    Social History:   reports that she quit smoking about 44 years ago. She has never used smokeless tobacco. She reports that she does not drink alcohol and does not use drugs.   Review of Systems: Constitutional: Denies weight loss/weight gain  Eyes: No changes in vision. ENT: No oral lesions, sore throat.  GI: see HPI.  Heme/Lymph: No easy bruising.  CV: No chest pain.  GU: No hematuria.  Integumentary: No rashes.  Neuro: No headaches.  Psych: No depression/anxiety.  Endocrine: No heat/cold intolerance.  Allergic/Immunologic: No urticaria.  Resp: No cough, SOB.  Musculoskeletal: No joint swelling.    Physical Examination: BP (!) 149/78 (BP Location: Right Arm, Patient Position: Sitting, Cuff Size: Normal)   Pulse 81   Temp 99.9 F (37.7 C) (Oral)   Ht 5\' 1"  (1.549 m)   Wt 161 lb 4.8 oz (73.2 kg)   BMI 30.48 kg/m  Gen: NAD, alert and oriented x 4 HEENT: PEERLA, EOMI, Neck: supple, no JVD Chest: CTA  bilaterally, no wheezes, crackles, or other adventitious sounds CV: RRR, no m/g/c/r Abd: soft, NT, ND, +BS in all four quadrants; no HSM, guarding, ridigity, or rebound tenderness Ext: no edema, well perfused with 2+ pulses, Skin: no rash or lesions noted on observed skin Lymph: no noted LAD  Data Reviewed:  2019-infectious GI path panel normal.    Assessment/Plan: Jodi Webb is a 86 y.o. female seen for diarrhea  1.  Diarrhea-acute on chronic. Will check stool studies to exclude infectious etiology. She reports her daughter is on dicyclomine which works Rec[employer] consultant for her and she would like to try this.  We will give to use as needed as she reports still having incontinence with Imodium.  If symptoms continue would consider  colonoscopy to exclude microscopic colitis etc. but hold off currently given her advanced age.  Reviewed to contact me with any side effects of dicyclomine given her age. Reports PCP monitors labs  2.  GERD-well-controlled on Protonix and this will be refilled.  She has no upper GI alarm symptoms.    Amila was seen today for follow-up.  Diagnoses and all orders for this visit:  Irritable bowel syndrome with diarrhea -     Gastrointestinal Pathogen Panel PCR -     C. difficile GDH and Toxin A/B  Other orders -     dicyclomine (BENTYL) 10 MG capsule; Take 1 capsule (10 mg total) by mouth 3 (three) times daily as needed (diarrhea). -     pantoprazole (PROTONIX) 40 MG tablet; Take 1 tablet (40 mg total) by mouth every morning.       I personally performed the service, non-incident to. (WP)  Laurine Blazer, [facility]

## 2020-07-06 NOTE — Patient Instructions (Signed)
We are checking stool studies for evaluation. Try dicyclomine as needed, okay to use twice a day if needed. Try limit coffee, sodas, spicy foods.

## 2020-10-06 ENCOUNTER — Ambulatory Visit (INDEPENDENT_AMBULATORY_CARE_PROVIDER_SITE_OTHER): Payer: Medicare Other | Admitting: Gastroenterology

## 2022-02-15 ENCOUNTER — Other Ambulatory Visit: Payer: Self-pay

## 2022-02-15 ENCOUNTER — Emergency Department (HOSPITAL_COMMUNITY): Payer: Medicare Other

## 2022-02-15 ENCOUNTER — Emergency Department (HOSPITAL_COMMUNITY)
Admission: EM | Admit: 2022-02-15 | Discharge: 2022-02-15 | Disposition: A | Discharge status: Home / Self Care | Payer: Medicare Other | Attending: Student | Admitting: Student

## 2022-02-15 ENCOUNTER — Encounter (HOSPITAL_COMMUNITY): Payer: Self-pay

## 2022-02-15 DIAGNOSIS — Z79899 Other long term (current) drug therapy: Secondary | ICD-10-CM | POA: Diagnosis not present

## 2022-02-15 DIAGNOSIS — N200 Calculus of kidney: Secondary | ICD-10-CM

## 2022-02-15 DIAGNOSIS — I10 Essential (primary) hypertension: Secondary | ICD-10-CM | POA: Diagnosis not present

## 2022-02-15 DIAGNOSIS — E876 Hypokalemia: Secondary | ICD-10-CM | POA: Insufficient documentation

## 2022-02-15 DIAGNOSIS — R197 Diarrhea, unspecified: Secondary | ICD-10-CM | POA: Diagnosis present

## 2022-02-15 DIAGNOSIS — Z87891 Personal history of nicotine dependence: Secondary | ICD-10-CM | POA: Diagnosis not present

## 2022-02-15 DIAGNOSIS — U071 COVID-19: Secondary | ICD-10-CM | POA: Insufficient documentation

## 2022-02-15 DIAGNOSIS — N132 Hydronephrosis with renal and ureteral calculous obstruction: Secondary | ICD-10-CM | POA: Diagnosis not present

## 2022-02-15 DIAGNOSIS — E119 Type 2 diabetes mellitus without complications: Secondary | ICD-10-CM | POA: Diagnosis not present

## 2022-02-15 LAB — CBC WITH DIFFERENTIAL/PLATELET
Abs Immature Granulocytes: 0 10*3/uL (ref 0.00–0.07)
Basophils Absolute: 0 10*3/uL (ref 0.0–0.1)
Basophils Relative: 0 %
Eosinophils Absolute: 0 10*3/uL (ref 0.0–0.5)
Eosinophils Relative: 1 %
HCT: 35.2 % — ABNORMAL LOW (ref 36.0–46.0)
Hemoglobin: 10.1 g/dL — ABNORMAL LOW (ref 12.0–15.0)
Immature Granulocytes: 0 %
Lymphocytes Relative: 18 %
Lymphs Abs: 0.8 10*3/uL (ref 0.7–4.0)
MCH: 26.2 pg (ref 26.0–34.0)
MCHC: 28.7 g/dL — ABNORMAL LOW (ref 30.0–36.0)
MCV: 91.2 fL (ref 80.0–100.0)
Monocytes Absolute: 0.5 10*3/uL (ref 0.1–1.0)
Monocytes Relative: 10 %
Neutro Abs: 3.3 10*3/uL (ref 1.7–7.7)
Neutrophils Relative %: 71 %
Platelets: 187 10*3/uL (ref 150–400)
RBC: 3.86 MIL/uL — ABNORMAL LOW (ref 3.87–5.11)
RDW: 15.1 % (ref 11.5–15.5)
WBC: 4.7 10*3/uL (ref 4.0–10.5)
nRBC: 0 % (ref 0.0–0.2)

## 2022-02-15 LAB — C DIFFICILE QUICK SCREEN W PCR REFLEX
C Diff antigen: NEGATIVE
C Diff interpretation: NOT DETECTED
C Diff toxin: NEGATIVE

## 2022-02-15 LAB — URINALYSIS, ROUTINE W REFLEX MICROSCOPIC
Bilirubin Urine: NEGATIVE
Glucose, UA: NEGATIVE mg/dL
Ketones, ur: 15 mg/dL — AB
Nitrite: NEGATIVE
Protein, ur: NEGATIVE mg/dL
Specific Gravity, Urine: <1.005 — ABNORMAL LOW (ref 1.005–1.030)
pH: 5.5 (ref 5.0–8.0)

## 2022-02-15 LAB — COMPREHENSIVE METABOLIC PANEL
ALT: 21 U/L (ref 0–44)
AST: 20 U/L (ref 15–41)
Albumin: 4 g/dL (ref 3.5–5.0)
Alkaline Phosphatase: 61 U/L (ref 38–126)
Anion gap: 13 (ref 5–15)
BUN: 12 mg/dL (ref 8–23)
CO2: 24 mmol/L (ref 22–32)
Calcium: 9.1 mg/dL (ref 8.9–10.3)
Chloride: 100 mmol/L (ref 98–111)
Creatinine, Ser: 0.64 mg/dL (ref 0.44–1.00)
GFR, Estimated: >60 mL/min (ref >60)
Glucose, Bld: 99 mg/dL (ref 70–99)
Potassium: 3.1 mmol/L — ABNORMAL LOW (ref 3.5–5.1)
Sodium: 137 mmol/L (ref 135–145)
Total Bilirubin: 0.6 mg/dL (ref 0.3–1.2)
Total Protein: 7.2 g/dL (ref 6.5–8.1)

## 2022-02-15 LAB — URINALYSIS, MICROSCOPIC (REFLEX)

## 2022-02-15 LAB — RESP PANEL BY RT-PCR (FLU A&B, COVID) ARPGX2
Influenza A by PCR: NEGATIVE
Influenza B by PCR: NEGATIVE
SARS Coronavirus 2 by RT PCR: POSITIVE — AB

## 2022-02-15 LAB — LACTIC ACID, PLASMA: Lactic Acid, Venous: 1 mmol/L (ref 0.5–1.9)

## 2022-02-15 MED ORDER — MORPHINE SULFATE (PF) 2 MG/ML IV SOLN
2.0000 mg | Freq: Once | INTRAVENOUS | Status: AC
  Administered 2022-02-15: 2 mg via INTRAVENOUS
  Filled 2022-02-15: qty 1

## 2022-02-15 MED ORDER — OXYCODONE HCL 5 MG PO TABS: 5.0000 mg | ORAL_TABLET | ORAL | 0 refills | Status: DC | PRN

## 2022-02-15 MED ORDER — LACTATED RINGERS IV BOLUS
1000.0000 mL | Freq: Once | INTRAVENOUS | Status: AC
  Administered 2022-02-15: 1000 mL via INTRAVENOUS

## 2022-02-15 MED ORDER — DIPHENOXYLATE-ATROPINE 2.5-0.025 MG PO TABS: 2.0000 | ORAL_TABLET | Freq: Four times a day (QID) | ORAL | 1 refills | Status: DC | PRN

## 2022-02-15 MED ORDER — ONDANSETRON HCL 4 MG/2ML IJ SOLN
4.0000 mg | Freq: Once | INTRAMUSCULAR | Status: AC
  Administered 2022-02-15: 4 mg via INTRAVENOUS
  Filled 2022-02-15: qty 2

## 2022-02-15 MED ORDER — IOHEXOL 300 MG/ML  SOLN
100.0000 mL | Freq: Once | INTRAMUSCULAR | Status: AC | PRN
  Administered 2022-02-15: 100 mL via INTRAVENOUS

## 2022-02-15 MED ORDER — NIRMATRELVIR/RITONAVIR (PAXLOVID)TABLET: 3.0000 | ORAL_TABLET | Freq: Two times a day (BID) | ORAL | 0 refills | Status: AC

## 2022-02-15 MED ORDER — TAMSULOSIN HCL 0.4 MG PO CAPS: 0.4000 mg | ORAL_CAPSULE | Freq: Every day | ORAL | 0 refills | Status: DC

## 2022-02-15 NOTE — ED Provider Notes (Signed)
[facility] EMERGENCY DEPARTMENT Provider Note  CSN: [id number] Arrival date & time: 02/15/22 2836  Chief Complaint(s) Diarrhea  HPI Parisha Beaulac is a 88 y.o. female with PMH chronic diarrhea, T2DM, HLD, HTN, IBS who presents emergency department for evaluation of abdominal pain flank pain and diarrhea.  Patient states that although she does have chronic diarrhea the diarrhea has worsened in the last 4 days and is now mucousy and foul-smelling.  She was evaluated 3 weeks ago with negative CT imaging for her abdominal pain but states that the pain is worse over the last 4 days.  She is currently on Lomotil and this is not relieving her diarrhea.  Denies chest pain, nausea, vomiting, headache, fever or other systemic symptoms.   Diarrhea Associated symptoms: abdominal pain    Past Medical History Past Medical History:  Diagnosis Date   Chronic diarrhea    Colitis, infectious 03/31/2016   Diabetes mellitus without complication (HCC)    Diverticulosis    GERD (gastroesophageal reflux disease)    Hyperlipemia    Hypertension    IBS (irritable bowel syndrome)    Renal disorder    kidney stones   Patient Active Problem List   Diagnosis Date Noted   Respiratory failure with hypoxia ([place]) 04/02/2016   Hematochezia 04/01/2016   Acute blood loss anemia 04/01/2016   Type 2 diabetes mellitus without complication ([place]) 62/94/7654   Diverticulosis of colon 04/01/2016   Chronic UTI 04/01/2016   Colitis, infectious 03/31/2016   Diabetes mellitus without complication ([facility])    Essential hypertension    Renal disorder    Hyperlipemia    IBS (irritable bowel syndrome)    Diverticulosis    GERD (gastroesophageal reflux disease)    Home Medication(s) Prior to Admission medications   Medication Sig Start Date End Date Taking? Authorizing Provider  acetaminophen (TYLENOL) 500 MG tablet Take 500 mg by mouth as needed.    [provider]  ALPRAZolam Duanne Moron) 1 MG tablet Take 1  mg by mouth 2 (two) times daily.    [provider]  Ascorbic Acid (VITAMIN C) 1000 MG tablet Take 1,000 mg by mouth daily.    [provider]  aspirin EC 81 MG tablet Take 81 mg by mouth every morning.    [provider]  atorvastatin (LIPITOR) 10 MG tablet Take 10 mg by mouth every morning.    [provider]  bisacodyl (DULCOLAX) 10 MG suppository Place 1 suppository (10 mg total) rectally as needed for moderate constipation. Patient not taking: Reported on 07/06/2020 12/02/18   Rogene Houston, MD  cholecalciferol (VITAMIN D) 1000 UNITS tablet Take 5,000 Units by mouth daily. Reported on 05/01/2016    [provider]  Cyanocobalamin (VITAMIN B-12 PO) Take 1 tablet by mouth daily.    [provider]  dicyclomine (BENTYL) 10 MG capsule Take 1 capsule (10 mg total) by mouth 3 (three) times daily as needed (diarrhea). 07/06/20   Ezzard Standing, PA-C  diltiazem (DILACOR XR) 240 MG 24 hr capsule Take 240 mg by mouth daily.    [provider]  DULoxetine (CYMBALTA) 60 MG capsule Take 60 mg by mouth every morning.     [provider]  lisinopril (PRINIVIL,ZESTRIL) 5 MG tablet Take 5 mg by mouth every morning.    [provider]  loperamide (IMODIUM) 2 MG capsule Take 1 capsule (2 mg total) by mouth daily as needed for diarrhea or loose stools. 12/02/18   Rogene Houston, MD  Multiple Vitamins-Minerals (MULTIVITAMINS THER. W/MINERALS) TABS tablet Take 1 tablet by mouth daily.    [provider]  Omega-3 Fatty Acids (FISH OIL PO) Take 1 capsule by mouth daily. Reported on 05/01/2016    [provider]  OVER THE COUNTER MEDICATION Mucous Relief - Takes 1 by mouth in the morning and the evening.    [provider]  pantoprazole (PROTONIX) 40 MG tablet Take 1 tablet (40 mg total) by mouth every morning. 07/06/20   Ezzard Standing, PA-C  Zinc 30 MG CAPS Take 30 mg by mouth.    [provider]                                                                                                                                     Past Surgical History Past Surgical History:  Procedure Laterality Date   ABDOMINAL HYSTERECTOMY     BACK SURGERY     CHOLECYSTECTOMY     COLONOSCOPY     2/1/2 year ago [name] , Dr.O'Neal   kidney stents     UPPER GASTROINTESTINAL ENDOSCOPY     2/1/2 year ago , [name], Dr.O'Neal.   Family History Family History  Problem Relation Age of Onset   Ovarian cancer Mother    Heart disease Mother    Cirrhosis Father    Hip dysplasia Sister    ALS Sister    Arthritis Sister    Colon cancer Sister    Dementia Sister    Cirrhosis Daughter     Social History Social History   Tobacco Use   Smoking status: Former    Types: Cigarettes    Quit date: 05/01/1976    Years since quitting: 45.8   Smokeless tobacco: Never  Substance Use Topics   Alcohol use: No    Alcohol/week: 0.0 standard drinks   Drug use: No   Allergies Penicillins  Review of Systems Review of Systems  Gastrointestinal:  Positive for abdominal pain and diarrhea.  Genitourinary:  Positive for flank pain.   Physical Exam Vital Signs  I have reviewed the triage vital signs There were no vitals taken for this visit.  Physical Exam Vitals and nursing note reviewed.  Constitutional:      General: She is not in acute distress.    Appearance: She is well-developed.  HENT:     Head: Normocephalic and atraumatic.  Eyes:     Conjunctiva/sclera: Conjunctivae normal.  Cardiovascular:     Rate and Rhythm: Normal rate and regular rhythm.     Heart sounds: No murmur heard. Pulmonary:     Effort: Pulmonary effort is normal. No respiratory distress.     Breath sounds: Normal breath sounds.  Abdominal:     Palpations: Abdomen is soft.     Tenderness: There is abdominal tenderness.  Musculoskeletal:        General: No swelling.     Cervical back: Neck  supple.  Skin:     General: Skin is warm and dry.     Capillary Refill: Capillary refill takes less than 2 seconds.  Neurological:     Mental Status: She is alert.  Psychiatric:        Mood and Affect: Mood normal.    ED Results and Treatments Labs (all labs ordered are listed, but only abnormal results are displayed) Labs Reviewed - No data to display                                                                                                                        Radiology No results found.  Pertinent labs & imaging results that were available during my care of the patient were reviewed by me and considered in my medical decision making (see MDM for details).  Medications Ordered in ED Medications - No data to display                                                                                                                                   Procedures Procedures  (including critical care time)  Medical Decision Making / ED Course   This patient presents to the ED for concern of abdominal pain and diarrhea, this involves an extensive number of treatment options, and is a complaint that carries with it a high risk of complications and morbidity.  The differential diagnosis includes C. difficile colitis, infectious colitis, COVID-19, influenza, nephrolithiasis, intra-abdominal infection  MDM: Patient seen emergency department for evaluation of diarrhea and abdominal pain.  Physical exam with right CVA tenderness but is otherwise unremarkable.  Laboratory evaluation with mild hypokalemia 3.1 likely secondary to her decreased p.o. intake and diarrhea.  Hemoglobin 10.1, creatinine is normal at 0.64, urinalysis with 21-50 red blood cells.  CT abdomen pelvis with a 0.4 cm right ureteral stone.  Patient with stable vital signs here in the emergency department I low suspicion for septic stone.  Patient is also COVID-positive which likely source of her worsening acute on chronic diarrhea.  She was fluid  resuscitated here in the emergency department and given pain control with morphine.  Patient cannot take NSAIDs in the setting of her current subdural hematoma.  On reevaluation she was able to tolerate p.o. without difficulty.  My hope is that the oxycodone in the short-term will lessen the patient's diarrhea and she was instructed to continue her Lomotil.  She will follow-up with both her primary care physician and her urologist.  Patient then discharged on Flomax, paxlovid, Lomotil and oxycodone.   Additional history obtained: -Additional history obtained from daughter -External records from outside source obtained and reviewed including: Chart review including previous notes, labs, imaging, consultation notes   Lab Tests: -I ordered, reviewed, and interpreted labs.   The pertinent results include:   Labs Reviewed - No data to display       Imaging Studies ordered: I ordered imaging studies including CTAP I independently visualized and interpreted imaging. I agree with the radiologist interpretation   Medicines ordered and prescription drug management: No orders of the defined types were placed in this encounter.   -I have reviewed the patients home medicines and have made adjustments as needed  Critical interventions none   Cardiac Monitoring: The patient was maintained on a cardiac monitor.  I personally viewed and interpreted the cardiac monitored which showed an underlying rhythm of: NSR  Social Determinants of Health:  Factors impacting patients care include: none   Reevaluation: After the interventions noted above, I reevaluated the patient and found that they have :improved  Co morbidities that complicate the patient evaluation  Past Medical History:  Diagnosis Date   Chronic diarrhea    Colitis, infectious 03/31/2016   Diabetes mellitus without complication (HCC)    Diverticulosis    GERD (gastroesophageal reflux disease)    Hyperlipemia    Hypertension     IBS (irritable bowel syndrome)    Renal disorder    kidney stones      Dispostion: I considered admission for this patient, but the patient does not meet inpatient criteria with normal laboratory work-up and stable vital signs, patient is safe for outpatient follow-up with urology and primary care.  She was also instructed to follow-up with her GI doctor for her acute on chronic diarrhea.     Final Clinical Impression(s) / ED Diagnoses Final diagnoses:  None     @PCDICTATION @    Teressa Lower, MD 02/15/22 1551

## 2022-02-15 NOTE — ED Notes (Signed)
Per patient family member, pt has oxygen at home as needed and when sleeping at night. Pt on 2 L because oxygen is high 80/90%

## 2022-02-15 NOTE — ED Triage Notes (Signed)
Patient with history of chronic diarrhea that has gotten worse over the past 3 days.

## 2022-02-16 LAB — GASTROINTESTINAL PANEL BY PCR, STOOL (REPLACES STOOL CULTURE)

## 2022-02-22 ENCOUNTER — Encounter (HOSPITAL_COMMUNITY): Payer: Self-pay | Admitting: *Deleted

## 2022-02-22 ENCOUNTER — Inpatient Hospital Stay (HOSPITAL_COMMUNITY)
Admission: EM | Admit: 2022-02-22 | Discharge: 2022-02-26 | DRG: 872 | Disposition: A | Discharge status: Skilled Nursing Facility | Payer: Medicare Other | Attending: Internal Medicine | Admitting: Internal Medicine

## 2022-02-22 ENCOUNTER — Emergency Department (HOSPITAL_COMMUNITY): Payer: Medicare Other

## 2022-02-22 ENCOUNTER — Observation Stay (HOSPITAL_COMMUNITY): Payer: Medicare Other

## 2022-02-22 DIAGNOSIS — I10 Essential (primary) hypertension: Secondary | ICD-10-CM | POA: Diagnosis present

## 2022-02-22 DIAGNOSIS — J449 Chronic obstructive pulmonary disease, unspecified: Secondary | ICD-10-CM | POA: Diagnosis present

## 2022-02-22 DIAGNOSIS — R296 Repeated falls: Secondary | ICD-10-CM | POA: Diagnosis present

## 2022-02-22 DIAGNOSIS — A419 Sepsis, unspecified organism: Secondary | ICD-10-CM | POA: Diagnosis present

## 2022-02-22 DIAGNOSIS — E869 Volume depletion, unspecified: Secondary | ICD-10-CM | POA: Diagnosis present

## 2022-02-22 DIAGNOSIS — E782 Mixed hyperlipidemia: Secondary | ICD-10-CM | POA: Diagnosis present

## 2022-02-22 DIAGNOSIS — Z880 Allergy status to penicillin: Secondary | ICD-10-CM | POA: Diagnosis not present

## 2022-02-22 DIAGNOSIS — Z87891 Personal history of nicotine dependence: Secondary | ICD-10-CM | POA: Diagnosis not present

## 2022-02-22 DIAGNOSIS — Z8616 Personal history of COVID-19: Secondary | ICD-10-CM

## 2022-02-22 DIAGNOSIS — Z8249 Family history of ischemic heart disease and other diseases of the circulatory system: Secondary | ICD-10-CM

## 2022-02-22 DIAGNOSIS — A4151 Sepsis due to Escherichia coli [E. coli]: Principal | ICD-10-CM | POA: Diagnosis present

## 2022-02-22 DIAGNOSIS — Z66 Do not resuscitate: Secondary | ICD-10-CM | POA: Diagnosis present

## 2022-02-22 DIAGNOSIS — R1310 Dysphagia, unspecified: Secondary | ICD-10-CM | POA: Diagnosis not present

## 2022-02-22 DIAGNOSIS — D509 Iron deficiency anemia, unspecified: Secondary | ICD-10-CM

## 2022-02-22 DIAGNOSIS — Z9049 Acquired absence of other specified parts of digestive tract: Secondary | ICD-10-CM | POA: Diagnosis not present

## 2022-02-22 DIAGNOSIS — R627 Adult failure to thrive: Secondary | ICD-10-CM | POA: Diagnosis present

## 2022-02-22 DIAGNOSIS — R197 Diarrhea, unspecified: Secondary | ICD-10-CM | POA: Diagnosis not present

## 2022-02-22 DIAGNOSIS — D508 Other iron deficiency anemias: Secondary | ICD-10-CM | POA: Diagnosis not present

## 2022-02-22 DIAGNOSIS — Z8041 Family history of malignant neoplasm of ovary: Secondary | ICD-10-CM

## 2022-02-22 DIAGNOSIS — R509 Fever, unspecified: Secondary | ICD-10-CM | POA: Diagnosis present

## 2022-02-22 DIAGNOSIS — Z90710 Acquired absence of both cervix and uterus: Secondary | ICD-10-CM

## 2022-02-22 DIAGNOSIS — Z79899 Other long term (current) drug therapy: Secondary | ICD-10-CM

## 2022-02-22 DIAGNOSIS — N10 Acute tubulo-interstitial nephritis: Secondary | ICD-10-CM | POA: Diagnosis not present

## 2022-02-22 DIAGNOSIS — I629 Nontraumatic intracranial hemorrhage, unspecified: Secondary | ICD-10-CM

## 2022-02-22 DIAGNOSIS — Z9981 Dependence on supplemental oxygen: Secondary | ICD-10-CM

## 2022-02-22 DIAGNOSIS — N179 Acute kidney failure, unspecified: Secondary | ICD-10-CM | POA: Diagnosis present

## 2022-02-22 DIAGNOSIS — E119 Type 2 diabetes mellitus without complications: Secondary | ICD-10-CM | POA: Diagnosis present

## 2022-02-22 DIAGNOSIS — A0472 Enterocolitis due to Clostridium difficile, not specified as recurrent: Secondary | ICD-10-CM

## 2022-02-22 DIAGNOSIS — R110 Nausea: Secondary | ICD-10-CM | POA: Diagnosis not present

## 2022-02-22 DIAGNOSIS — Z87440 Personal history of urinary (tract) infections: Secondary | ICD-10-CM

## 2022-02-22 DIAGNOSIS — Z800 Family history of malignant neoplasm of digestive organs: Secondary | ICD-10-CM

## 2022-02-22 DIAGNOSIS — R6510 Systemic inflammatory response syndrome (SIRS) of non-infectious origin without acute organ dysfunction: Secondary | ICD-10-CM

## 2022-02-22 DIAGNOSIS — J9691 Respiratory failure, unspecified with hypoxia: Secondary | ICD-10-CM | POA: Diagnosis present

## 2022-02-22 DIAGNOSIS — N136 Pyonephrosis: Secondary | ICD-10-CM | POA: Diagnosis present

## 2022-02-22 DIAGNOSIS — R6520 Severe sepsis without septic shock: Secondary | ICD-10-CM | POA: Diagnosis present

## 2022-02-22 DIAGNOSIS — K219 Gastro-esophageal reflux disease without esophagitis: Secondary | ICD-10-CM | POA: Diagnosis present

## 2022-02-22 DIAGNOSIS — J9611 Chronic respiratory failure with hypoxia: Secondary | ICD-10-CM

## 2022-02-22 DIAGNOSIS — U071 COVID-19: Secondary | ICD-10-CM | POA: Diagnosis present

## 2022-02-22 DIAGNOSIS — E8770 Fluid overload, unspecified: Secondary | ICD-10-CM | POA: Diagnosis present

## 2022-02-22 DIAGNOSIS — K449 Diaphragmatic hernia without obstruction or gangrene: Secondary | ICD-10-CM | POA: Diagnosis present

## 2022-02-22 HISTORY — DX: Nausea: R11.0

## 2022-02-22 LAB — URINALYSIS, ROUTINE W REFLEX MICROSCOPIC
Bilirubin Urine: NEGATIVE
Glucose, UA: NEGATIVE mg/dL
Ketones, ur: NEGATIVE mg/dL
Nitrite: POSITIVE — AB
Protein, ur: 30 mg/dL — AB
Specific Gravity, Urine: 1.016 (ref 1.005–1.030)
WBC, UA: >50 WBC/hpf — ABNORMAL HIGH (ref 0–5)
pH: 5 (ref 5.0–8.0)

## 2022-02-22 LAB — CBC WITH DIFFERENTIAL/PLATELET
Abs Immature Granulocytes: 0.07 10*3/uL (ref 0.00–0.07)
Basophils Absolute: 0 10*3/uL (ref 0.0–0.1)
Basophils Relative: 0 %
Eosinophils Absolute: 0 10*3/uL (ref 0.0–0.5)
Eosinophils Relative: 0 %
HCT: 29.9 % — ABNORMAL LOW (ref 36.0–46.0)
Hemoglobin: 8.6 g/dL — ABNORMAL LOW (ref 12.0–15.0)
Immature Granulocytes: 1 %
Lymphocytes Relative: 3 %
Lymphs Abs: 0.5 10*3/uL — ABNORMAL LOW (ref 0.7–4.0)
MCH: 25.7 pg — ABNORMAL LOW (ref 26.0–34.0)
MCHC: 28.8 g/dL — ABNORMAL LOW (ref 30.0–36.0)
MCV: 89.5 fL (ref 80.0–100.0)
Monocytes Absolute: 1.1 10*3/uL — ABNORMAL HIGH (ref 0.1–1.0)
Monocytes Relative: 7 %
Neutro Abs: 13.1 10*3/uL — ABNORMAL HIGH (ref 1.7–7.7)
Neutrophils Relative %: 89 %
Platelets: 205 10*3/uL (ref 150–400)
RBC: 3.34 MIL/uL — ABNORMAL LOW (ref 3.87–5.11)
RDW: 15.7 % — ABNORMAL HIGH (ref 11.5–15.5)
WBC: 14.8 10*3/uL — ABNORMAL HIGH (ref 4.0–10.5)
nRBC: 0 % (ref 0.0–0.2)

## 2022-02-22 LAB — COMPREHENSIVE METABOLIC PANEL
ALT: 15 U/L (ref 0–44)
AST: 19 U/L (ref 15–41)
Albumin: 2.8 g/dL — ABNORMAL LOW (ref 3.5–5.0)
Alkaline Phosphatase: 49 U/L (ref 38–126)
Anion gap: 9 (ref 5–15)
BUN: 34 mg/dL — ABNORMAL HIGH (ref 8–23)
CO2: 28 mmol/L (ref 22–32)
Calcium: 8.7 mg/dL — ABNORMAL LOW (ref 8.9–10.3)
Chloride: 98 mmol/L (ref 98–111)
Creatinine, Ser: 1.04 mg/dL — ABNORMAL HIGH (ref 0.44–1.00)
GFR, Estimated: 52 mL/min — ABNORMAL LOW (ref >60)
Glucose, Bld: 124 mg/dL — ABNORMAL HIGH (ref 70–99)
Potassium: 3.7 mmol/L (ref 3.5–5.1)
Sodium: 135 mmol/L (ref 135–145)
Total Bilirubin: 0.5 mg/dL (ref 0.3–1.2)
Total Protein: 6.5 g/dL (ref 6.5–8.1)

## 2022-02-22 LAB — APTT: aPTT: 33 seconds (ref 24–36)

## 2022-02-22 LAB — LACTIC ACID, PLASMA: Lactic Acid, Venous: 1.1 mmol/L (ref 0.5–1.9)

## 2022-02-22 LAB — RESP PANEL BY RT-PCR (FLU A&B, COVID) ARPGX2
Influenza A by PCR: NEGATIVE
Influenza B by PCR: NEGATIVE
SARS Coronavirus 2 by RT PCR: POSITIVE — AB

## 2022-02-22 LAB — PROTIME-INR
INR: 1.1 (ref 0.8–1.2)
Prothrombin Time: 14.5 seconds (ref 11.4–15.2)

## 2022-02-22 MED ORDER — CIPROFLOXACIN IN D5W 400 MG/200ML IV SOLN
400.0000 mg | Freq: Once | INTRAVENOUS | Status: AC
  Administered 2022-02-22: 400 mg via INTRAVENOUS
  Filled 2022-02-22: qty 200

## 2022-02-22 MED ORDER — ONDANSETRON HCL 4 MG/2ML IJ SOLN: 4.0000 mg | Freq: Four times a day (QID) | INTRAMUSCULAR | Status: DC | PRN

## 2022-02-22 MED ORDER — SODIUM CHLORIDE 0.9 % IV SOLN
1.0000 g | INTRAVENOUS | Status: DC
  Administered 2022-02-22 – 2022-02-25 (×4): 1 g via INTRAVENOUS
  Filled 2022-02-22 (×4): qty 10

## 2022-02-22 MED ORDER — OXYCODONE-ACETAMINOPHEN 5-325 MG PO TABS
1.0000 | ORAL_TABLET | Freq: Once | ORAL | Status: AC
  Administered 2022-02-22: 1 via ORAL
  Filled 2022-02-22: qty 1

## 2022-02-22 MED ORDER — TAMSULOSIN HCL 0.4 MG PO CAPS
0.4000 mg | ORAL_CAPSULE | Freq: Every day | ORAL | Status: DC
  Administered 2022-02-22 – 2022-02-26 (×5): 0.4 mg via ORAL
  Filled 2022-02-22 (×5): qty 1

## 2022-02-22 MED ORDER — DILTIAZEM HCL ER COATED BEADS 120 MG PO CP24
240.0000 mg | ORAL_CAPSULE | Freq: Every day | ORAL | Status: DC
  Administered 2022-02-23 – 2022-02-26 (×4): 240 mg via ORAL
  Filled 2022-02-22 (×8): qty 2

## 2022-02-22 MED ORDER — OXYCODONE HCL 5 MG PO TABS
5.0000 mg | ORAL_TABLET | Freq: Four times a day (QID) | ORAL | Status: DC | PRN
  Administered 2022-02-22 – 2022-02-25 (×6): 5 mg via ORAL
  Filled 2022-02-22 (×6): qty 1

## 2022-02-22 MED ORDER — ACETAMINOPHEN 650 MG RE SUPP: 650.0000 mg | Freq: Four times a day (QID) | RECTAL | Status: DC | PRN

## 2022-02-22 MED ORDER — ACETAMINOPHEN 325 MG PO TABS: 650.0000 mg | ORAL_TABLET | Freq: Four times a day (QID) | ORAL | Status: DC | PRN

## 2022-02-22 MED ORDER — ONDANSETRON HCL 4 MG PO TABS: 4.0000 mg | ORAL_TABLET | Freq: Four times a day (QID) | ORAL | Status: DC | PRN

## 2022-02-22 MED ORDER — POTASSIUM CHLORIDE IN NACL 20-0.9 MEQ/L-% IV SOLN: INTRAVENOUS | Status: DC

## 2022-02-22 MED ORDER — ALPRAZOLAM 0.5 MG PO TABS
0.5000 mg | ORAL_TABLET | Freq: Three times a day (TID) | ORAL | Status: DC
Start: 2022-02-22 — End: 2022-02-26
  Administered 2022-02-22 – 2022-02-26 (×11): 0.5 mg via ORAL
  Filled 2022-02-22 (×11): qty 1

## 2022-02-22 MED ORDER — ESCITALOPRAM OXALATE 10 MG PO TABS
10.0000 mg | ORAL_TABLET | Freq: Every day | ORAL | Status: DC
  Administered 2022-02-23 – 2022-02-26 (×4): 10 mg via ORAL
  Filled 2022-02-22 (×4): qty 1

## 2022-02-22 MED ORDER — SODIUM CHLORIDE 0.9 % IV BOLUS
2000.0000 mL | Freq: Once | INTRAVENOUS | Status: AC
  Administered 2022-02-22: 1000 mL via INTRAVENOUS

## 2022-02-22 NOTE — Progress Notes (Signed)
Elink following code sepsis °

## 2022-02-22 NOTE — ED Provider Notes (Signed)
[facility] EMERGENCY DEPARTMENT Provider Note   CSN: [id number] Arrival date & time: 02/22/22  1234     History  Chief Complaint  Patient presents with   Fever   Shortness of Breath    Jodi Webb is a 88 y.o. female.  Patient with vomiting.  She was diagnosed with a kidney stone a week ago and then urinary tract infection 2 days ago and has not been able to keep fluids down.  Patient also has COVID.   Patient has history of GERD  The history is provided by the patient. No language interpreter was used.  Emesis Severity:  Moderate Timing:  Constant Quality:  Bilious material Able to tolerate:  Liquids Progression:  Worsening Chronicity:  Recurrent Recent urination:  Normal Relieved by:  Nothing Worsened by:  Nothing Ineffective treatments:  None tried Associated symptoms: no abdominal pain, no cough, no diarrhea and no headaches       Home Medications Prior to Admission medications   Medication Sig Start Date End Date Taking? Authorizing Provider  acetaminophen (TYLENOL) 500 MG tablet Take 500 mg by mouth as needed.    [provider]  ALPRAZolam Duanne Moron) 0.5 MG tablet Take 1 mg by mouth 3 (three) times daily.    [provider]  atorvastatin (LIPITOR) 10 MG tablet Take 10 mg by mouth every morning.    [provider]  bisacodyl (DULCOLAX) 10 MG suppository Place 1 suppository (10 mg total) rectally as needed for moderate constipation. Patient not taking: Reported on 07/06/2020 12/02/18   Rogene Houston, MD  dicyclomine (BENTYL) 10 MG capsule Take 1 capsule (10 mg total) by mouth 3 (three) times daily as needed (diarrhea). Patient not taking: Reported on 02/15/2022 07/06/20   Ezzard Standing, PA-C  diltiazem (DILACOR XR) 240 MG 24 hr capsule Take 240 mg by mouth daily.    [provider]  diphenoxylate-atropine (LOMOTIL) 2.5-0.025 MG tablet Take 2 tablets by mouth 4 (four) times daily as needed for diarrhea or loose stools.  02/15/22   Kommor, Madison, MD  escitalopram (LEXAPRO) 10 MG tablet Take 10 mg by mouth daily.    [provider]  furosemide (LASIX) 20 MG tablet Take 20 mg by mouth daily. On Mondays  Wednesday and fridays    [provider]  hydrOXYzine (ATARAX) 10 MG tablet Take 10 mg by mouth 3 (three) times daily as needed for itching.    [provider]  loperamide (IMODIUM) 2 MG capsule Take 1 capsule (2 mg total) by mouth daily as needed for diarrhea or loose stools. Patient not taking: Reported on 02/15/2022 12/02/18   Rogene Houston, MD  losartan (COZAAR) 25 MG tablet Take 25 mg by mouth daily.    [provider]  ondansetron (ZOFRAN) 4 MG tablet Take 4 mg by mouth 2 (two) times daily.    [provider]  oxyCODONE (ROXICODONE) 5 MG immediate release tablet Take 1 tablet (5 mg total) by mouth every 4 (four) hours as needed for severe pain. 02/15/22   Kommor, Madison, MD  pantoprazole (PROTONIX) 40 MG tablet Take 1 tablet (40 mg total) by mouth every morning. Patient not taking: Reported on 02/15/2022 07/06/20   Laurine Blazer B, PA-C  tamsulosin (FLOMAX) 0.4 MG CAPS capsule Take 1 capsule (0.4 mg total) by mouth daily for 7 days. 02/15/22 02/22/22  Kommor, Madison, MD  torsemide (DEMADEX) 100 MG tablet Take 100 mg by mouth daily. Monday Wednesday and friday    [provider]      Allergies    Penicillins    Review of Systems   Review of Systems  Constitutional:  Negative for appetite change and fatigue.  HENT:  Negative for congestion, ear discharge and sinus pressure.   Eyes:  Negative for discharge.  Respiratory:  Negative for cough.   Cardiovascular:  Negative for chest pain.  Gastrointestinal:  Positive for vomiting. Negative for abdominal pain and diarrhea.  Genitourinary:  Negative for frequency and hematuria.  Musculoskeletal:  Negative for back pain.  Skin:  Negative for rash.  Neurological:  Negative for seizures and headaches.   Psychiatric/Behavioral:  Negative for hallucinations.    Physical Exam Updated Vital Signs BP (!) 110/52    Pulse (!) 58    Temp 99.1 F (37.3 C) (Oral)    Resp 19    SpO2 94%  Physical Exam Vitals and nursing note reviewed.  Constitutional:      Appearance: She is well-developed.  HENT:     Head: Normocephalic.     Nose: Nose normal.  Eyes:     General: No scleral icterus.    Conjunctiva/sclera: Conjunctivae normal.  Neck:     Thyroid: No thyromegaly.  Cardiovascular:     Rate and Rhythm: Normal rate and regular rhythm.     Heart sounds: No murmur heard.   No friction rub. No gallop.  Pulmonary:     Breath sounds: No stridor. No wheezing or rales.  Chest:     Chest wall: No tenderness.  Abdominal:     General: There is no distension.     Tenderness: There is no abdominal tenderness. There is no rebound.  Musculoskeletal:        General: Normal range of motion.     Cervical back: Neck supple.  Lymphadenopathy:     Cervical: No cervical adenopathy.  Skin:    Findings: No erythema or rash.  Neurological:     Mental Status: She is oriented to person, place, and time.     Motor: No abnormal muscle tone.     Coordination: Coordination normal.  Psychiatric:        Behavior: Behavior normal.    ED Results / Procedures / Treatments   Labs (all labs ordered are listed, but only abnormal results are displayed) Labs Reviewed  COMPREHENSIVE METABOLIC PANEL - Abnormal; Notable for the following components:      Result Value   Glucose, Bld 124 (*)    BUN 34 (*)    Creatinine, Ser 1.04 (*)    Calcium 8.7 (*)    Albumin 2.8 (*)    GFR, Estimated 52 (*)    All other components within normal limits  CBC WITH DIFFERENTIAL/PLATELET - Abnormal; Notable for the following components:   WBC 14.8 (*)    RBC 3.34 (*)    Hemoglobin 8.6 (*)    HCT 29.9 (*)    MCH 25.7 (*)    MCHC 28.8 (*)    RDW 15.7 (*)    Neutro Abs 13.1 (*)    Lymphs Abs 0.5 (*)    Monocytes Absolute 1.1  (*)    All other components within normal limits  RESP PANEL BY RT-PCR (FLU A&B, COVID) ARPGX2  CULTURE, BLOOD (ROUTINE X 2)  CULTURE, BLOOD (ROUTINE X 2)  URINE CULTURE  LACTIC ACID, PLASMA  PROTIME-INR  APTT  URINALYSIS, ROUTINE W REFLEX MICROSCOPIC    EKG None  Radiology CT Renal Stone Study  Result Date: 02/22/2022 CLINICAL DATA:  Flank pain EXAM: CT ABDOMEN AND PELVIS WITHOUT CONTRAST TECHNIQUE: Multidetector CT imaging of the abdomen and pelvis was performed following the standard protocol without IV contrast. RADIATION DOSE REDUCTION: This exam was performed according to the departmental dose-optimization program which includes automated exposure control, adjustment of the mA and/or kV according to patient size and/or use of iterative reconstruction technique. COMPARISON:  CT abdomen pelvis contrast 02/15/2022 FINDINGS: Lower chest: Trace bilateral pleural effusions. Additional linear and strandy lung base opacities likely sequelae of atelectasis. Hepatobiliary: No focal liver abnormality is seen. Status post cholecystectomy. No biliary dilatation. Pancreas: Unremarkable. No pancreatic ductal dilatation or surrounding inflammatory changes. Spleen: Normal in size without focal abnormality. Adrenals/Urinary Tract: Adrenal glands are normal. Bilateral simple renal cysts are present with the largest located in the lower pole the left kidney measuring 7.5 cm. 2 mm nonobstructing calculus seen in the lower pole of the right kidney. 6 mm nonobstructing calculus present in the upper pole of the left kidney. No hydronephrosis or hydroureter. There is increased density of the bladder lumen. Stomach/Bowel: Large hiatal hernia. Moderate sigmoid colon diverticulosis without evidence of acute diverticulitis. No dilated loops of bowel to indicate ileus or obstruction. Appendix is not definitively identified. Vascular/Lymphatic: No enlarged lymph nodes. Atherosclerotic calcifications seen throughout the  abdominal aorta and visualized branches. Reproductive: Status post hysterectomy. No adnexal masses. Other: No abdominal wall hernia or abnormality. No abdominopelvic ascites. Musculoskeletal: Diffuse osteopenia. Mild compression deformity of the L1 vertebral body is unchanged from prior exam. IMPRESSION: 1. No acute abnormality of the abdomen or pelvis. 2. Nonobstructing bilateral renal calculi. No evidence of obstructive uropathy. 3. Sigmoid colon diverticulosis without evidence of acute diverticulitis. 4. Large hiatal hernia. 5. Hyperdense content of the bladder lumen may be due to contrast excreted from recent CT, however blood products can also have similar appearance. Please correlate with patient history for hematuria. Electronically Signed   By: Miachel Roux M.D.   On: 02/22/2022 14:45    Procedures Procedures    Medications Ordered in ED Medications  ciprofloxacin (CIPRO) IVPB 400 mg (400 mg Intravenous New Bag/Given 02/22/22 1440)  sodium chloride 0.9 % bolus 2,000 mL (1,000 mLs Intravenous New Bag/Given 02/22/22 1435)    ED Course/ Medical Decision Making/ A&P                           Medical Decision Making Amount and/or Complexity of Data Reviewed Labs: ordered. Radiology: ordered. ECG/medicine tests: ordered.  Risk Prescription drug management. Decision regarding hospitalization.   Patient with AKI and probable urinary tract infection.  She will be admitted to medicine   This patient presents to the ED for concern of vomiting, this involves an extensive number of treatment options, and is a complaint that carries with it a high risk of complications and morbidity.  The differential diagnosis includes UTI, kidney stone   Co morbidities that complicate the patient evaluation  Kidney stone   Additional history obtained:  Additional history obtained from patient External records from outside source obtained and reviewed including hospital records   Lab Tests:  I  Ordered, and personally interpreted labs.  The pertinent results include: CBC chemistries and urinalysis which showed elevated white blood count and anemia at 8.6 along with BUN elevated at 34, COVID-positive and urinalysis shows elevated white blood count with bacteria indicative of UTI   Imaging Studies ordered:  I ordered imaging studies including chest x-ray and CT scan I independently visualized and interpreted  imaging which showed CT scan shows no kidney stone, and chest x-ray shows possible left lower lobe infiltrate I agree with the radiologist interpretation   Cardiac Monitoring:  The patient was maintained on a cardiac monitor.  I personally viewed and interpreted the cardiac monitored which showed an underlying rhythm of: Sinus tach   Medicines ordered and prescription drug management:  I ordered medication including normal saline for dehydration and Cipro for infection Reevaluation of the patient after these medicines showed that the patient improved I have reviewed the patients home medicines and have made adjustments as needed   Test Considered:  None   Critical Interventions:  IV antibiotics   Consultations Obtained:  I requested consultation with the hospitalist,  and discussed lab and imaging findings as well as pertinent plan - they recommend: Admission for IV antibiotics   Problem List / ED Course:  UTI, vomiting   Reevaluation:  After the interventions noted above, I reevaluated the patient and found that they have :improved   Social Determinants of Health:  None   Dispostion:  After consideration of the diagnostic results and the patients response to treatment, I feel that the patent would benefit from admission to the hospital for urinary tract infection with IV antibiotics.          Final Clinical Impression(s) / ED Diagnoses Final diagnoses:  AKI (acute kidney injury) [facility])    Rx / [facility] Orders ED Discharge Orders     None          Milton Ferguson, MD 02/23/22 1654

## 2022-02-22 NOTE — Progress Notes (Addendum)
Pharmacy Antibiotic Note  Charleston Vierling is a 88 y.o. female admitted on 02/22/2022 with sepsis/UTI.  Pharmacy has been consulted for Ciprofloxacin dosing. Patient has a history of UTI resistant to cipro (02/2016); Hx PCN allergy but per family, tolerated cephalexin last month. Consulted with MD and will change to Rocephin.  Plan: DC Cipro Rocephin 1gm IV q 24hr F/u cultures    Temp (24hrs), Avg:99.4 F (37.4 C), Min:98.6 F (37 C), Max:100.4 F (38 C)  Recent Labs  Lab 02/22/22 1348  WBC 14.8*  CREATININE 1.04*  LATICACIDVEN 1.1    Estimated Creatinine Clearance: 33.6 mL/min (A) (by C-G formula based on SCr of 1.04 mg/dL (H)).    Allergies  Allergen Reactions   Penicillins Shortness Of Breath and Rash    Tolerates cephalosporins    Antimicrobials this admission: 2/23 Cipro x1 2/23 Rocephin >>   Dose adjustments this admission:  Microbiology results: 2/23 BCx >> 2/23 UCx >>   Thank you for allowing pharmacy to be a part of this patients care.  Lorenso Courier [facility] 02/22/2022 5:53 PM

## 2022-02-22 NOTE — ED Triage Notes (Signed)
Fever with weakness for over a week, also has a kidney stone, not drinking fluids

## 2022-02-22 NOTE — Progress Notes (Signed)
Notified bedside nurse of need to administer antibiotics and fluid bolus.  

## 2022-02-22 NOTE — H&P (Addendum)
History and Physical    Amparo Donalson WER:[id number] DOB: June 17, 1932 DOA: 02/22/2022  PCP: [name], DO   Patient coming from: Home  I have personally briefly reviewed patient's old medical records in [place]  Chief Complaint: Weakness, fever  HPI: Jodi Webb is a 88 y.o. female with medical history significant for  DM, HTN, nephrolithiasis, chronic respiratory failure on 2 L. Patient was brought to the ED with reports of fever and weakness for about a week.  Patient was seen here in the ED 2/16, with complaints of diarrhea, she was diagnosed with COVID-19, treated with Paxlovid. Two days ago 2/21, patient was taken to the ED in [place] with reports of back pain, urinary symptoms, weakness and fevers, she was diagnosed with urinary tract infection, told she had a kidney stone in her ureter.  She was discharged on ciprofloxacin, but she has not been able to keep the medication down. Over the past few days, patient has been very weak, today she was unable to get up.  Reports poor oral intake. Diarrhea is improving, still watery, but 1-2 times daily now.  She reports pain with urination, and persistent right flank pain.  ED Course: Temp 100.4.  Heart rate 50s to 60s.  Respiratory rate 15-20.  Blood pressure systolic 195-093.  O2 sats 79% on room air, she was placed on her home 2 L sats greater than 94%.  WBC 14.8.  Lactic acid 1.1.  Creatinine mildly elevated at 1.04.  Abdominal CT-shows nonobstructing bilateral renal calculi.  No evidence of obstructive uropathy, hyperdense content of the bladder lumen may be due to contrast or blood products. IV ciprofloxacin given.  2 L bolus given.  Hospitalist to admit for possible sepsis.  Review of Systems: As per HPI all other systems reviewed and negative.  Past Medical History:  Diagnosis Date   Chronic diarrhea    Colitis, infectious 03/31/2016   Diabetes mellitus without complication (HCC)    Diverticulosis     GERD (gastroesophageal reflux disease)    Hyperlipemia    Hypertension    IBS (irritable bowel syndrome)    Renal disorder    kidney stones    Past Surgical History:  Procedure Laterality Date   ABDOMINAL HYSTERECTOMY     BACK SURGERY     CHOLECYSTECTOMY     COLONOSCOPY     2/1/2 year ago [name] , Dr.O'Neal   kidney stents     UPPER GASTROINTESTINAL ENDOSCOPY     2/1/2 year ago , [place], Dr.O'Neal.     reports that she quit smoking about 45 years ago. She has never used smokeless tobacco. She reports that she does not drink alcohol and does not use drugs.  Allergies  Allergen Reactions   Penicillins Shortness Of Breath and Rash    Family History  Problem Relation Age of Onset   Ovarian cancer Mother    Heart disease Mother    Cirrhosis Father    Hip dysplasia Sister    ALS Sister    Arthritis Sister    Colon cancer Sister    Dementia Sister    Cirrhosis Daughter    Prior to Admission medications   Medication Sig Start Date End Date Taking? Authorizing Provider  acetaminophen (TYLENOL) 500 MG tablet Take 500 mg by mouth as needed.    [provider]  ALPRAZolam Duanne Moron) 0.5 MG tablet Take 1 mg by mouth 3 (three) times daily.    [provider]  atorvastatin (LIPITOR) 10 MG  tablet Take 10 mg by mouth every morning.    [provider]  bisacodyl (DULCOLAX) 10 MG suppository Place 1 suppository (10 mg total) rectally as needed for moderate constipation. Patient not taking: Reported on 07/06/2020 12/02/18   Rogene Houston, MD  dicyclomine (BENTYL) 10 MG capsule Take 1 capsule (10 mg total) by mouth 3 (three) times daily as needed (diarrhea). Patient not taking: Reported on 02/15/2022 07/06/20   Ezzard Standing, PA-C  diltiazem (DILACOR XR) 240 MG 24 hr capsule Take 240 mg by mouth daily.    [provider]  diphenoxylate-atropine (LOMOTIL) 2.5-0.025 MG tablet Take 2 tablets by mouth 4 (four) times daily as needed for  diarrhea or loose stools. 02/15/22   Kommor, Madison, MD  escitalopram (LEXAPRO) 10 MG tablet Take 10 mg by mouth daily.    [provider]  furosemide (LASIX) 20 MG tablet Take 20 mg by mouth daily. On Mondays  Wednesday and fridays    [provider]  hydrOXYzine (ATARAX) 10 MG tablet Take 10 mg by mouth 3 (three) times daily as needed for itching.    [provider]  loperamide (IMODIUM) 2 MG capsule Take 1 capsule (2 mg total) by mouth daily as needed for diarrhea or loose stools. Patient not taking: Reported on 02/15/2022 12/02/18   Rogene Houston, MD  losartan (COZAAR) 25 MG tablet Take 25 mg by mouth daily.    [provider]  ondansetron (ZOFRAN) 4 MG tablet Take 4 mg by mouth 2 (two) times daily.    [provider]  oxyCODONE (ROXICODONE) 5 MG immediate release tablet Take 1 tablet (5 mg total) by mouth every 4 (four) hours as needed for severe pain. 02/15/22   Kommor, Madison, MD  pantoprazole (PROTONIX) 40 MG tablet Take 1 tablet (40 mg total) by mouth every morning. Patient not taking: Reported on 02/15/2022 07/06/20   Laurine Blazer B, PA-C  tamsulosin (FLOMAX) 0.4 MG CAPS capsule Take 1 capsule (0.4 mg total) by mouth daily for 7 days. 02/15/22 02/22/22  Kommor, Madison, MD  torsemide (DEMADEX) 100 MG tablet Take 100 mg by mouth daily. Monday Wednesday and friday    [provider]    Physical Exam: Vitals:   02/22/22 1445 02/22/22 1500 02/22/22 1515 02/22/22 1530  BP:  (!) 110/52  (!) 102/54  Pulse: (!) 55 (!) 58 (!) 56 (!) 56  Resp: 20 19 17 15   Temp:      TempSrc:      SpO2: 97% 94% 95% 94%    Constitutional: NAD, calm, comfortable Vitals:   02/22/22 1445 02/22/22 1500 02/22/22 1515 02/22/22 1530  BP:  (!) 110/52  (!) 102/54  Pulse: (!) 55 (!) 58 (!) 56 (!) 56  Resp: 20 19 17 15   Temp:      TempSrc:      SpO2: 97% 94% 95% 94%   Eyes: PERRL, lids and conjunctivae normal ENMT: Mucous membranes are dry.   Neck:  normal, supple, no masses, no thyromegaly Respiratory: clear to auscultation bilaterally, no wheezing, no crackles. Normal respiratory effort. No accessory muscle use.  Cardiovascular: Regular rate and rhythm, no murmurs / rubs / gallops. No extremity edema.  Lower extremities warm. Abdomen: no tenderness, no masses palpated. No hepatosplenomegaly. Bowel sounds positive.  Musculoskeletal: no clubbing / cyanosis. No joint deformity upper and lower extremities. Good ROM, no contractures. Normal muscle tone.  Skin: no rashes, lesions, ulcers. No induration Neurologic: No apparent cranial nerve abnormality, 4/5 strength  in all extremities Psychiatric: Normal judgment and insight. Alert and oriented x 3. Normal mood.   Labs on Admission: I have personally reviewed following labs and imaging studies  CBC: Recent Labs  Lab 02/22/22 1348  WBC 14.8*  NEUTROABS 13.1*  HGB 8.6*  HCT 29.9*  MCV 89.5  PLT 786   Basic Metabolic Panel: Recent Labs  Lab 02/22/22 1348  NA 135  K 3.7  CL 98  CO2 28  GLUCOSE 124*  BUN 34*  CREATININE 1.04*  CALCIUM 8.7*   GFR: Estimated Creatinine Clearance: 33.6 mL/min (A) (by C-G formula based on SCr of 1.04 mg/dL (H)). Liver Function Tests: Recent Labs  Lab 02/22/22 1348  AST 19  ALT 15  ALKPHOS 49  BILITOT 0.5  PROT 6.5  ALBUMIN 2.8*   Coagulation Profile: Recent Labs  Lab 02/22/22 1348  INR 1.1   Radiological Exams on Admission: CT Renal Stone Study  Result Date: 02/22/2022 CLINICAL DATA:  Flank pain EXAM: CT ABDOMEN AND PELVIS WITHOUT CONTRAST TECHNIQUE: Multidetector CT imaging of the abdomen and pelvis was performed following the standard protocol without IV contrast. RADIATION DOSE REDUCTION: This exam was performed according to the departmental dose-optimization program which includes automated exposure control, adjustment of the mA and/or kV according to patient size and/or use of iterative reconstruction technique. COMPARISON:  CT  abdomen pelvis contrast 02/15/2022 FINDINGS: Lower chest: Trace bilateral pleural effusions. Additional linear and strandy lung base opacities likely sequelae of atelectasis. Hepatobiliary: No focal liver abnormality is seen. Status post cholecystectomy. No biliary dilatation. Pancreas: Unremarkable. No pancreatic ductal dilatation or surrounding inflammatory changes. Spleen: Normal in size without focal abnormality. Adrenals/Urinary Tract: Adrenal glands are normal. Bilateral simple renal cysts are present with the largest located in the lower pole the left kidney measuring 7.5 cm. 2 mm nonobstructing calculus seen in the lower pole of the right kidney. 6 mm nonobstructing calculus present in the upper pole of the left kidney. No hydronephrosis or hydroureter. There is increased density of the bladder lumen. Stomach/Bowel: Large hiatal hernia. Moderate sigmoid colon diverticulosis without evidence of acute diverticulitis. No dilated loops of bowel to indicate ileus or obstruction. Appendix is not definitively identified. Vascular/Lymphatic: No enlarged lymph nodes. Atherosclerotic calcifications seen throughout the abdominal aorta and visualized branches. Reproductive: Status post hysterectomy. No adnexal masses. Other: No abdominal wall hernia or abnormality. No abdominopelvic ascites. Musculoskeletal: Diffuse osteopenia. Mild compression deformity of the L1 vertebral body is unchanged from prior exam. IMPRESSION: 1. No acute abnormality of the abdomen or pelvis. 2. Nonobstructing bilateral renal calculi. No evidence of obstructive uropathy. 3. Sigmoid colon diverticulosis without evidence of acute diverticulitis. 4. Large hiatal hernia. 5. Hyperdense content of the bladder lumen may be due to contrast excreted from recent CT, however blood products can also have similar appearance. Please correlate with patient history for hematuria. Electronically Signed   By: Miachel Roux M.D.   On: 02/22/2022 14:45    EKG:  Independently reviewed.  Sinus rhythm rate 62.  QTc 414.  No old EKG to compare.  Assessment/Plan Principal Problem:   Severe sepsis (HCC) Active Problems:   AKI (acute kidney injury) ([place])   Diabetes mellitus without complication ([place])   Essential hypertension   Respiratory failure with hypoxia (HCC)   Intracranial hemorrhage (HCC)   COVID-19 virus infection  Severe sepsis 2/2 UTI -febrile 100.4, leukocytosis of 14.8, AKI.  Lactic acid normal 1.1.  Denies respiratory symptoms.  Recent UTI diagnosis, able to tolerate oral therapy with ciprofloxacin prescribed  2/21.  UA suggestive of UTI with positive nitrites leukocytes many bacteria.   - last Urine culture 01/30/2022 per Care Everywhere grew greater than 100,000 mixed urogenital flora. -Penicillin allergy noted, pharmacy consulted, patient has tolerated cephalosporins- will use IV Rocephin (Per pharmacy resistance to Cipro in 2017) -Follow-up urine and blood cultures -2 L bolus given, continue N/s + 20 kcl 75 cc/hr x1 day -Portable chest x-ray  AKI-creatinine 1.04, baseline 0.5-0.6.  Likely prerenal from poor oral intake and diarrhea. -Hold losartan  COVID-19 infection-diagnosed 02/15/22.  Was treated with Paxlovid completed course.  Vaccinated against [employer].  No prior COVID-19 infection.  Had mild GI symptoms.  No respiratory symptoms. -Continue COVID-19 specific precautions for now.  Diabetes mellitus-glucose 124.  Not on medications. -Check blood glucose daily - HgbA1c  Hypertension -stable. -Resume Cardizem -Hold losartan, as needed Lasix with AKI  Chronic hypoxic respiratory failure-2 L.  At this time she denies respiratory symptoms.  Intracranial hemorrhage-Per care everywhere sustained intracranial tumor rate 10/2021, 2/2 fall.  The patient still has hematoma and "a slow leak" for which she will be getting a follow-up CT as outpatient soon. - SCDs For now  Nephrolithiasis-recent stones in ureter appeared to have passed.   Bladder showing hyperdense material contrast versus hematuria. -Resume tamsulosin   DVT prophylaxis: SCDs Code Status: DNR, confirmed with patient and her daughter Lenna Sciara at bedside. Family Communication: Daughter Melissa at bedside who patient lives with. Disposition Plan: > 2 days Consults called: none Admission status: inpt tele I certify that at the point of admission it is my clinical judgment that the patient will require inpatient hospital care spanning beyond 2 midnights from the point of admission due to high intensity of service, high risk for further deterioration and high frequency of surveillance required.   Bethena Roys MD [facility]  02/22/2022, 5:19 PM

## 2022-02-23 DIAGNOSIS — A419 Sepsis, unspecified organism: Secondary | ICD-10-CM | POA: Diagnosis not present

## 2022-02-23 DIAGNOSIS — R197 Diarrhea, unspecified: Secondary | ICD-10-CM | POA: Diagnosis not present

## 2022-02-23 DIAGNOSIS — D509 Iron deficiency anemia, unspecified: Secondary | ICD-10-CM | POA: Diagnosis not present

## 2022-02-23 DIAGNOSIS — R1310 Dysphagia, unspecified: Secondary | ICD-10-CM | POA: Diagnosis not present

## 2022-02-23 DIAGNOSIS — R110 Nausea: Secondary | ICD-10-CM | POA: Diagnosis not present

## 2022-02-23 DIAGNOSIS — N10 Acute tubulo-interstitial nephritis: Secondary | ICD-10-CM | POA: Diagnosis not present

## 2022-02-23 DIAGNOSIS — J9611 Chronic respiratory failure with hypoxia: Secondary | ICD-10-CM | POA: Diagnosis not present

## 2022-02-23 DIAGNOSIS — N179 Acute kidney failure, unspecified: Secondary | ICD-10-CM | POA: Diagnosis not present

## 2022-02-23 LAB — C DIFFICILE QUICK SCREEN W PCR REFLEX
C Diff antigen: POSITIVE — AB
C Diff toxin: NEGATIVE

## 2022-02-23 LAB — IRON AND TIBC
Iron: 8 ug/dL — ABNORMAL LOW (ref 28–170)
Saturation Ratios: 4 % — ABNORMAL LOW (ref 10.4–31.8)
TIBC: 226 ug/dL — ABNORMAL LOW (ref 250–450)
UIBC: 218 ug/dL

## 2022-02-23 LAB — GLUCOSE, CAPILLARY
Glucose-Capillary: 114 mg/dL — ABNORMAL HIGH (ref 70–99)
Glucose-Capillary: 156 mg/dL — ABNORMAL HIGH (ref 70–99)
Glucose-Capillary: 147 mg/dL — ABNORMAL HIGH (ref 70–99)

## 2022-02-23 LAB — BASIC METABOLIC PANEL
Anion gap: 6 (ref 5–15)
BUN: 28 mg/dL — ABNORMAL HIGH (ref 8–23)
CO2: 26 mmol/L (ref 22–32)
Calcium: 8.2 mg/dL — ABNORMAL LOW (ref 8.9–10.3)
Chloride: 104 mmol/L (ref 98–111)
Creatinine, Ser: 0.71 mg/dL (ref 0.44–1.00)
GFR, Estimated: >60 mL/min (ref >60)
Glucose, Bld: 110 mg/dL — ABNORMAL HIGH (ref 70–99)
Potassium: 3.8 mmol/L (ref 3.5–5.1)
Sodium: 136 mmol/L (ref 135–145)

## 2022-02-23 LAB — VITAMIN B12: Vitamin B-12: 396 pg/mL (ref 180–914)

## 2022-02-23 LAB — FOLATE: Folate: 12.1 ng/mL (ref >5.9)

## 2022-02-23 LAB — C-REACTIVE PROTEIN: CRP: 12.2 mg/dL — ABNORMAL HIGH (ref <1.0)

## 2022-02-23 LAB — CBC
HCT: 25.5 % — ABNORMAL LOW (ref 36.0–46.0)
Hemoglobin: 7 g/dL — ABNORMAL LOW (ref 12.0–15.0)
MCH: 24.6 pg — ABNORMAL LOW (ref 26.0–34.0)
MCHC: 27.5 g/dL — ABNORMAL LOW (ref 30.0–36.0)
MCV: 89.8 fL (ref 80.0–100.0)
Platelets: 165 10*3/uL (ref 150–400)
RBC: 2.84 MIL/uL — ABNORMAL LOW (ref 3.87–5.11)
RDW: 15.7 % — ABNORMAL HIGH (ref 11.5–15.5)
WBC: 9.4 10*3/uL (ref 4.0–10.5)
nRBC: 0 % (ref 0.0–0.2)

## 2022-02-23 LAB — FERRITIN: Ferritin: 38 ng/mL (ref 11–307)

## 2022-02-23 LAB — HEMOGLOBIN AND HEMATOCRIT, BLOOD
HCT: 31.8 % — ABNORMAL LOW (ref 36.0–46.0)
Hemoglobin: 9.1 g/dL — ABNORMAL LOW (ref 12.0–15.0)

## 2022-02-23 LAB — TSH: TSH: 0.329 u[IU]/mL — ABNORMAL LOW (ref 0.350–4.500)

## 2022-02-23 LAB — ABO/RH: ABO/RH(D): B POS

## 2022-02-23 LAB — BRAIN NATRIURETIC PEPTIDE: B Natriuretic Peptide: 381 pg/mL — ABNORMAL HIGH (ref 0.0–100.0)

## 2022-02-23 LAB — HEMOGLOBIN A1C
Hgb A1c MFr Bld: 5.7 % — ABNORMAL HIGH (ref 4.8–5.6)
Mean Plasma Glucose: 116.89 mg/dL

## 2022-02-23 LAB — PREPARE RBC (CROSSMATCH)

## 2022-02-23 LAB — CLOSTRIDIUM DIFFICILE BY PCR, REFLEXED: Toxigenic C. Difficile by PCR: POSITIVE — AB

## 2022-02-23 LAB — OCCULT BLOOD X 1 CARD TO LAB, STOOL: Fecal Occult Bld: POSITIVE — AB

## 2022-02-23 LAB — PROCALCITONIN: Procalcitonin: 0.91 ng/mL

## 2022-02-23 MED ORDER — PANTOPRAZOLE SODIUM 40 MG PO TBEC
40.0000 mg | DELAYED_RELEASE_TABLET | Freq: Every day | ORAL | Status: DC
  Administered 2022-02-23 – 2022-02-26 (×4): 40 mg via ORAL
  Filled 2022-02-23 (×4): qty 1

## 2022-02-23 MED ORDER — POTASSIUM CHLORIDE IN NACL 20-0.9 MEQ/L-% IV SOLN
INTRAVENOUS | Status: DC
Start: 2022-02-23 — End: 2022-02-24

## 2022-02-23 MED ORDER — SODIUM CHLORIDE 0.9% IV SOLUTION: Freq: Once | INTRAVENOUS | Status: AC

## 2022-02-23 MED ORDER — CHOLESTYRAMINE LIGHT 4 G PO PACK
4.0000 g | PACK | Freq: Every day | ORAL | Status: DC
  Administered 2022-02-23 – 2022-02-26 (×4): 4 g via ORAL
  Filled 2022-02-23 (×6): qty 1

## 2022-02-23 NOTE — Assessment & Plan Note (Signed)
Continue statin. 

## 2022-02-23 NOTE — Progress Notes (Signed)
RE: Jodi Webb Date Of Birth: 1932/09/03 Date: 02/23/2022  MUST ID: [id number]  To Whom It May Concern:   Please be advised that the above name patient will require a short-term nursing home stay - anticipated 30 days or less rehabilitation and strengthening. The plan is for return home.

## 2022-02-23 NOTE — Assessment & Plan Note (Addendum)
Patient follows with neurosurgery at [facility] She has frequent falls Recent CT on 02/05/2022 at OSH showed increased right SDH Patient has opted for surveillance CTs and observation Neck CT scheduled for 03/12/2022

## 2022-02-23 NOTE — Hospital Course (Addendum)
88 year old female with a history of diabetes mellitus type 2, hypertension, chronic respiratory failure on 2.5 L, hyperlipidemia, COPD, and chronic diarrhea presenting with 2-week history of generalized weakness, fevers, suprapubic pain and right flank pain.  The patient visited the emergency department at  [facility] on 02/15/2022 with the aforementioned symptoms.  She was diagnosed with COVID at that time.  A CT of the abdomen and pelvis on 02/15/2022 did show a 0.4 cm calculus in the proximal third of the right ureter with some hydronephrosis.  The patient was subsequently discharged home from the ED with Paxlovid, Flomax, Lomotil, and oxycodone.  Her symptoms did not improve.  She subsequently went to the emergency department in [place] on 02/20/2022.  She was sent home with a prescription for Cipro for which she took 4 doses.  She was given a 5-day prescription.  Unfortunately, she continued to be weaker with persistent fevers and nausea.  As result, she presented for further evaluation and treatment on 02/22/2022.  She denies any headache, visual disturbance, neck pain, chest pain, worsening shortness of breath, coughing, hemoptysis.  She does have dysuria without any hematuria.  There is no hematochezia or melena.  The patient was started on ceftriaxone.  CT renal stone protocol showed trace bilateral pleural effusions.  There was a 2 mm nonobstructive calculus on the right kidney, 6 mm nonobstructive calculus in the left kidney.  There was increased density in the urinary bladder.  WBC 14.8, hemoglobin 8.6, platelets 205,000.  Sodium 135, potassium 3.7, bicarbonate 28, serum creatinine 1.04.  The patient was started on ceftriaxone and IV fluids.  She developed fluid overload and was given torsemide po x 1 and lasix IV 40 mg IV x 1 with clinical improvement.  She continued to improve clinically and respiratory status remained stable.  Her po intake improved and she tolerated her diet.

## 2022-02-23 NOTE — Plan of Care (Signed)
°  Problem: Acute Rehab PT Goals(only PT should resolve) Goal: Pt Will Go Supine/Side To Sit Outcome: Progressing Flowsheets (Taken 02/23/2022 0906) Pt will go Supine/Side to Sit: with supervision Goal: Pt Will Go Sit To Supine/Side Outcome: Progressing Flowsheets (Taken 02/23/2022 0906) Pt will go Sit to Supine/Side: with supervision Goal: Patient Will Transfer Sit To/From Stand Outcome: Progressing Flowsheets (Taken 02/23/2022 0906) Patient will transfer sit to/from stand:  with supervision  with min guard assist Goal: Pt Will Transfer Bed To Chair/Chair To Bed Outcome: Progressing Flowsheets (Taken 02/23/2022 0906) Pt will Transfer Bed to Chair/Chair to Bed: min guard assist Goal: Pt Will Ambulate Outcome: Progressing Flowsheets (Taken 02/23/2022 0906) Pt will Ambulate:  25 feet  with min guard assist  with minimal assist Goal: Pt/caregiver will Perform Home Exercise Program Outcome: Progressing Flowsheets (Taken 02/23/2022 0906) Pt/caregiver will Perform Home Exercise Program:  For increased strengthening  For improved balance  Independently    9:07 AM, 02/23/22 Mearl Latin PT, DPT Physical Therapist at [facility] ([place])

## 2022-02-23 NOTE — Assessment & Plan Note (Addendum)
Check A1c--5/7 Allow for liberal glycemic control at this point given poor oral intake and FTT Not on any agents at home

## 2022-02-23 NOTE — Progress Notes (Signed)
PROGRESS NOTE  Jodi Webb XHB:[id number] DOB: 1932-05-06 DOA: 02/22/2022 PCP: [name], DO  Brief History:  88 year old female with a history of diabetes mellitus type 2, hypertension, chronic respiratory failure on 2.5 L, hyperlipidemia, COPD, and chronic diarrhea presenting with 2-week history of generalized weakness, fevers, suprapubic pain and right flank pain.  The patient visited the emergency department at [facility] on 02/15/2022 with the aforementioned symptoms.  She was diagnosed with COVID at that time.  A CT of the abdomen and pelvis on 02/15/2022 did show a 0.4 cm calculus in the proximal third of the right ureter with some hydronephrosis.  The patient was subsequently discharged home from the ED with Paxlovid, Flomax, Lomotil, and oxycodone.  Her symptoms did not improve.  She subsequently went to the emergency department in [place] on 02/20/2022.  She was sent home with a prescription for Cipro for which she took 4 doses.  She was given a 5-day prescription.  Unfortunately, she continued to be weaker with persistent fevers and nausea.  As result, she presented for further evaluation and treatment on 02/22/2022.  She denies any headache, visual disturbance, neck pain, chest pain, worsening shortness of breath, coughing, hemoptysis.  She does have dysuria without any hematuria.  There is no hematochezia or melena.  The patient was started on ceftriaxone.  CT renal stone protocol showed trace bilateral pleural effusions.  There was a 2 mm nonobstructive calculus on the right kidney, 6 mm nonobstructive calculus in the left kidney.  There was increased density in the urinary bladder.  WBC 14.8, hemoglobin 8.6, platelets 205,000.  Sodium 135, potassium 3.7, bicarbonate 28, serum creatinine 1.04.  The patient was started on ceftriaxone and IV fluids.     Assessment and Plan: * Severe sepsis ([place])- (present on admission) Present on admission Presented with leukocytosis, AKI,  and fever Secondary to urinary source UA >50WBC Continue ceftriaxone pending urine and blood culture data 02/22/2022 CT renal stone protocol as above Lactic acid 1.1  Acute pyelonephritis Patient has passed a right ureteral stone that was present on CT 02/15/2022 Continue ceftriaxone pending urine culture data Judicious opioids for pain  AKI (acute kidney injury) ([place])- (present on admission) Baseline creatinine 0.5-0.6 Presented with serum creatinine 1.04 Secondary to volume depletion Continue IV fluids  COVID-19 virus infection- (present on admission) Initially COVID-positive on 02/15/2022 Has finished 5 days Paxlovid Stable presently on her baseline 2.5 L nasal cannula  Chronic respiratory failure with hypoxia ([place]) Patient is chronically on 2.5 L nasal cannula -Stable presently  Diarrhea This has been a chronic issue Suspect IBS-D Check C. Difficile Stool pathogen panel Daughter requesting GI eval  Intracranial hemorrhage ([place]) Patient follows with neurosurgery at [facility] She has frequent falls Recent CT on 02/05/2022 at OSH showed increased right SDH Patient has opted for surveillance CTs and observation Neck CT scheduled for 03/12/2022  Controlled type 2 diabetes mellitus without complication, without long-term current use of insulin (HCC) Check A1c Allow for liberal glycemic control at this point given poor oral intake and FTT Not on any agents at home  Mixed hyperlipidemia Continue statin  Essential hypertension- (present on admission) Holding diltiazem, losartan secondary to soft blood pressure         Status is: Inpatient Remains inpatient appropriate because: severity of illness requiring IV abx and IVF and not tolerating diet            Family Communication:   daughter updated 2/24  Consultants:  GI  Code Status:  DNR  DVT Prophylaxis:  [place] Ds   Procedures: As Listed in Progress Note Above  Antibiotics: Ceftriaxone  2/23>>      Subjective: Patient complains of suprapubic pain and right flank pain that is a little bit better than yesterday.  She has some nausea without any emesis.  She denies any headache, neck pain, fevers, chills, chest pain, shortness of breath, cough, hemoptysis, hematochezia, melena.  She has not had any diarrhea since admission.  Objective: Vitals:   02/22/22 1745 02/22/22 1813 02/22/22 2059 02/23/22 0442  BP:  (!) 115/48 (!) 109/44 (!) 118/55  Pulse: 61 61 62 70  Resp: 15 18 18 18   Temp:  98.6 F (37 C) 99.4 F (37.4 C) 98.2 F (36.8 C)  TempSrc:  Oral    SpO2: 97% 99% 97% 93%  Weight:  72.1 kg    Height:  5\' 1"  (1.549 m)      Intake/Output Summary (Last 24 hours) at 02/23/2022 1696 Last data filed at 02/23/2022 0600 Gross per 24 hour  Intake 2433.49 ml  Output --  Net 2433.49 ml   Weight change:  Exam:  General:  Pt is alert, follows commands appropriately, not in acute distress HEENT: No icterus, No thrush, No neck mass, [place]/AT Cardiovascular: RRR, S1/S2, no rubs, no gallops Respiratory: Scattered bilateral rales diminished breath sounds.  No wheezing Abdomen: Soft/+BS, non tender, non distended, no guarding Extremities: Non pitting edema, No lymphangitis, No petechiae, No rashes, no synovitis   Data Reviewed: I have personally reviewed following labs and imaging studies Basic Metabolic Panel: Recent Labs  Lab 02/22/22 1348 02/23/22 0551  NA 135 136  K 3.7 3.8  CL 98 104  CO2 28 26  GLUCOSE 124* 110*  BUN 34* 28*  CREATININE 1.04* 0.71  CALCIUM 8.7* 8.2*   Liver Function Tests: Recent Labs  Lab 02/22/22 1348  AST 19  ALT 15  ALKPHOS 49  BILITOT 0.5  PROT 6.5  ALBUMIN 2.8*   No results for input(s): LIPASE, AMYLASE in the last 168 hours. No results for input(s): AMMONIA in the last 168 hours. Coagulation Profile: Recent Labs  Lab 02/22/22 1348  INR 1.1   CBC: Recent Labs  Lab 02/22/22 1348 02/23/22 0551  WBC 14.8* 9.4   NEUTROABS 13.1*  --   HGB 8.6* 7.0*  HCT 29.9* 25.5*  MCV 89.5 89.8  PLT 205 165   Cardiac Enzymes: No results for input(s): CKTOTAL, CKMB, CKMBINDEX, TROPONINI in the last 168 hours. BNP: Invalid input(s): POCBNP CBG: Recent Labs  Lab 02/23/22 0732  GLUCAP 114*   HbA1C: No results for input(s): HGBA1C in the last 72 hours. Urine analysis:    Component Value Date/Time   COLORURINE YELLOW 02/22/2022 1332   APPEARANCEUR CLOUDY (A) 02/22/2022 1332   LABSPEC 1.016 02/22/2022 1332   PHURINE 5.0 02/22/2022 1332   GLUCOSEU NEGATIVE 02/22/2022 1332   HGBUR SMALL (A) 02/22/2022 1332   BILIRUBINUR NEGATIVE 02/22/2022 1332   KETONESUR NEGATIVE 02/22/2022 1332   PROTEINUR 30 (A) 02/22/2022 1332   NITRITE POSITIVE (A) 02/22/2022 1332   LEUKOCYTESUR LARGE (A) 02/22/2022 1332   Sepsis Labs: @LABRCNTIP (procalcitonin:4,lacticidven:4) ) Recent Results (from the past 240 hour(s))  Resp Panel by RT-PCR (Flu A&B, Covid) Nasopharyngeal Swab     Status: Abnormal   Collection Time: 02/15/22  8:34 AM   Specimen: Nasopharyngeal Swab; Nasopharyngeal(NP) swabs in vial transport medium  Result Value Ref Range Status   SARS Coronavirus 2 by RT PCR  POSITIVE (A) NEGATIVE Final    Comment: (NOTE) SARS-CoV-2 target nucleic acids are DETECTED.  The SARS-CoV-2 RNA is generally detectable in upper respiratory specimens during the acute phase of infection. Positive results are indicative of the presence of the identified virus, but do not rule out bacterial infection or co-infection with other pathogens not detected by the test. Clinical correlation with patient history and other diagnostic information is necessary to determine patient infection status. The expected result is Negative.  Fact Sheet for Patients: EntrepreneurPulse.com.au  Fact Sheet for Healthcare Providers: IncredibleEmployment.be  This test is not yet approved or cleared by the [place]  FDA and  has been authorized for detection and/or diagnosis of SARS-CoV-2 by FDA under an Emergency Use Authorization (EUA).  This EUA will remain in effect (meaning this test can be used) for the duration of  the COVID-19 declaration under Section 564(b)(1) of the A ct, 21 U.S.C. section 360bbb-3(b)(1), unless the authorization is terminated or revoked sooner.     Influenza A by PCR NEGATIVE NEGATIVE Final   Influenza B by PCR NEGATIVE NEGATIVE Final    Comment: (NOTE) The Xpert Xpress SARS-CoV-2/FLU/RSV plus assay is intended as an aid in the diagnosis of influenza from Nasopharyngeal swab specimens and should not be used as a sole basis for treatment. Nasal washings and aspirates are unacceptable for Xpert Xpress SARS-CoV-2/FLU/RSV testing.  Fact Sheet for Patients: EntrepreneurPulse.com.au  Fact Sheet for Healthcare Providers: IncredibleEmployment.be  This test is not yet approved or cleared by the [place] FDA and has been authorized for detection and/or diagnosis of SARS-CoV-2 by FDA under an Emergency Use Authorization (EUA). This EUA will remain in effect (meaning this test can be used) for the duration of the COVID-19 declaration under Section 564(b)(1) of the Act, 21 U.S.C. section 360bbb-3(b)(1), unless the authorization is terminated or revoked.  Performed at [facility], [address], [place], [place] [address]   C Difficile Quick Screen w PCR reflex     Status: None   Collection Time: 02/15/22  8:35 AM   Specimen: STOOL  Result Value Ref Range Status   C Diff antigen NEGATIVE NEGATIVE Final   C Diff toxin NEGATIVE NEGATIVE Final   C Diff interpretation No C. difficile detected.  Final    Comment: Performed at [facility], [address] [place], [place] [address]  Gastrointestinal Panel by PCR , Stool     Status: None   Collection Time: 02/15/22  8:35 AM   Specimen: STOOL  Result Value Ref Range Status    Campylobacter species NOT DETECTED NOT DETECTED Final   Plesimonas shigelloides NOT DETECTED NOT DETECTED Final   Salmonella species NOT DETECTED NOT DETECTED Final   Yersinia enterocolitica NOT DETECTED NOT DETECTED Final   Vibrio species NOT DETECTED NOT DETECTED Final   Vibrio cholerae NOT DETECTED NOT DETECTED Final   Enteroaggregative E coli (EAEC) NOT DETECTED NOT DETECTED Final   Enteropathogenic E coli (EPEC) NOT DETECTED NOT DETECTED Final   Enterotoxigenic E coli (ETEC) NOT DETECTED NOT DETECTED Final   Shiga like toxin producing E coli (STEC) NOT DETECTED NOT DETECTED Final   Shigella/Enteroinvasive E coli (EIEC) NOT DETECTED NOT DETECTED Final   Cryptosporidium NOT DETECTED NOT DETECTED Final   Cyclospora cayetanensis NOT DETECTED NOT DETECTED Final   Entamoeba histolytica NOT DETECTED NOT DETECTED Final   Giardia lamblia NOT DETECTED NOT DETECTED Final   Adenovirus F40/41 NOT DETECTED NOT DETECTED Final   Astrovirus NOT DETECTED NOT DETECTED Final  Norovirus GI/GII NOT DETECTED NOT DETECTED Final   Rotavirus A NOT DETECTED NOT DETECTED Final   Sapovirus (I, II, IV, and V) NOT DETECTED NOT DETECTED Final    Comment: Performed at [facility], [address], [place], [place] [address]  Resp Panel by RT-PCR (Flu A&B, Covid) Nasopharyngeal Swab     Status: Abnormal   Collection Time: 02/22/22  1:32 PM   Specimen: Nasopharyngeal Swab; Nasopharyngeal(NP) swabs in vial transport medium  Result Value Ref Range Status   SARS Coronavirus 2 by RT PCR POSITIVE (A) NEGATIVE Final    Comment: (NOTE) SARS-CoV-2 target nucleic acids are DETECTED.  The SARS-CoV-2 RNA is generally detectable in upper respiratory specimens during the acute phase of infection. Positive results are indicative of the presence of the identified virus, but do not rule out bacterial infection or co-infection with other pathogens not detected by the test. Clinical correlation with patient history  and other diagnostic information is necessary to determine patient infection status. The expected result is Negative.  Fact Sheet for Patients: EntrepreneurPulse.com.au  Fact Sheet for Healthcare Providers: IncredibleEmployment.be  This test is not yet approved or cleared by the [place] FDA and  has been authorized for detection and/or diagnosis of SARS-CoV-2 by FDA under an Emergency Use Authorization (EUA).  This EUA will remain in effect (meaning this test can be used) for the duration of  the COVID-19 declaration under Section 564(b)(1) of the A ct, 21 U.S.C. section 360bbb-3(b)(1), unless the authorization is terminated or revoked sooner.     Influenza A by PCR NEGATIVE NEGATIVE Final   Influenza B by PCR NEGATIVE NEGATIVE Final    Comment: (NOTE) The Xpert Xpress SARS-CoV-2/FLU/RSV plus assay is intended as an aid in the diagnosis of influenza from Nasopharyngeal swab specimens and should not be used as a sole basis for treatment. Nasal washings and aspirates are unacceptable for Xpert Xpress SARS-CoV-2/FLU/RSV testing.  Fact Sheet for Patients: EntrepreneurPulse.com.au  Fact Sheet for Healthcare Providers: IncredibleEmployment.be  This test is not yet approved or cleared by the [place] FDA and has been authorized for detection and/or diagnosis of SARS-CoV-2 by FDA under an Emergency Use Authorization (EUA). This EUA will remain in effect (meaning this test can be used) for the duration of the COVID-19 declaration under Section 564(b)(1) of the Act, 21 U.S.C. section 360bbb-3(b)(1), unless the authorization is terminated or revoked.  Performed at [facility], [address] [place], [place] [address]   Blood Culture (routine x 2)     Status: None (Preliminary result)   Collection Time: 02/22/22  1:49 PM   Specimen: BLOOD LEFT ARM  Result Value Ref Range Status   Specimen  Description BLOOD LEFT ARM  Final   Special Requests   Final    BOTTLES DRAWN AEROBIC AND ANAEROBIC Blood Culture adequate volume Performed at [facility], [address], [place], [place] [address]    Culture PENDING  Incomplete   Report Status PENDING  Incomplete  Blood Culture (routine x 2)     Status: None (Preliminary result)   Collection Time: 02/22/22  1:49 PM   Specimen: BLOOD RIGHT ARM  Result Value Ref Range Status   Specimen Description BLOOD RIGHT ARM  Final   Special Requests   Final    BOTTLES DRAWN AEROBIC AND ANAEROBIC Blood Culture adequate volume Performed at [facility], [address] [place], [place] [address]    Culture PENDING  Incomplete   Report Status PENDING  Incomplete  Scheduled Meds:  ALPRAZolam  0.5 mg Oral TID   diltiazem  240 mg Oral Daily   escitalopram  10 mg Oral Daily   tamsulosin  0.4 mg Oral Daily   Continuous Infusions:  0.9 % NaCl with KCl 20 mEq / L 75 mL/hr at 02/22/22 1820   cefTRIAXone (ROCEPHIN)  IV 1 g (02/22/22 2129)    Procedures/Studies: CT ABDOMEN PELVIS W CONTRAST  Result Date: 02/15/2022 CLINICAL DATA:  Right lower quadrant abdominal pain, chronic diarrhea worsening recently EXAM: CT ABDOMEN AND PELVIS WITH CONTRAST TECHNIQUE: Multidetector CT imaging of the abdomen and pelvis was performed using the standard protocol following bolus administration of intravenous contrast. RADIATION DOSE REDUCTION: This exam was performed according to the departmental dose-optimization program which includes automated exposure control, adjustment of the mA and/or kV according to patient size and/or use of iterative reconstruction technique. CONTRAST:  133mL OMNIPAQUE IOHEXOL 300 MG/ML  SOLN COMPARISON:  03/31/2016 FINDINGS: Lower chest: No acute abnormality. Large hiatal hernia with intrathoracic position of the gastric body and fundus. Hepatobiliary: No focal liver abnormality is seen. Status post cholecystectomy. Mild postoperative biliary  dilatation. Pancreas: Unremarkable. No pancreatic ductal dilatation or surrounding inflammatory changes. Spleen: Normal in size without significant abnormality. Adrenals/Urinary Tract: Adrenal glands are unremarkable. There is a 0.4 cm calculus in the proximal third of the right ureter with mild associated hydronephrosis and hydroureter (series 2, image 44). Additional small bilateral nonobstructive calculi. Multiple simple, benign bilateral renal cortical cysts. Bladder is unremarkable. Stomach/Bowel: Stomach is within normal limits. Appendix is not clearly visualized and may be surgically absent. No evidence of bowel wall thickening, distention, or inflammatory changes. Sigmoid diverticula. Vascular/Lymphatic: Aortic atherosclerosis. No enlarged abdominal or pelvic lymph nodes. Reproductive: Status post hysterectomy. Other: No abdominal wall hernia or abnormality. No ascites. Musculoskeletal: There is a new, although age indeterminate superior endplate wedge deformity of the L1 vertebral body (series 6, image 70) IMPRESSION: 1. There is a 0.4 cm calculus in the proximal third of the right ureter with mild associated hydronephrosis and hydroureter. Additional small bilateral nonobstructive calculi. 2. Large hiatal hernia with intrathoracic position of the gastric body and fundus. 3. New, although age indeterminate superior endplate wedge deformity of the L1 vertebral body. Correlate for acutely referable pain and point tenderness. 4. Sigmoid diverticulosis without evidence of acute diverticulitis. Aortic Atherosclerosis (ICD10-I70.0). Electronically Signed   By: Delanna Ahmadi M.D.   On: 02/15/2022 11:04   DG CHEST PORT 1 VIEW  Result Date: 02/22/2022 CLINICAL DATA:  Fever and weakness. EXAM: PORTABLE CHEST 1 VIEW COMPARISON:  Chest x-ray 04/02/2016 and CT scan abdomen/pelvis, same date. FINDINGS: The heart is mildly enlarged but stable. There is tortuosity and calcification of the thoracic aorta. Stable right  paratracheal density. Moderate to large hiatal hernia. Small bilateral pleural effusions and left lower infiltrate better seen on the lung bases of the CT scan. IMPRESSION: 1. Small bilateral pleural effusions and left lower lobe infiltrate. 2. Stable hiatal hernia. Electronically Signed   By: Marijo Sanes M.D.   On: 02/22/2022 17:56   CT Renal Stone Study  Result Date: 02/22/2022 CLINICAL DATA:  Flank pain EXAM: CT ABDOMEN AND PELVIS WITHOUT CONTRAST TECHNIQUE: Multidetector CT imaging of the abdomen and pelvis was performed following the standard protocol without IV contrast. RADIATION DOSE REDUCTION: This exam was performed according to the departmental dose-optimization program which includes automated exposure control, adjustment of the mA and/or kV according to patient size and/or use of iterative reconstruction technique. COMPARISON:  CT abdomen  pelvis contrast 02/15/2022 FINDINGS: Lower chest: Trace bilateral pleural effusions. Additional linear and strandy lung base opacities likely sequelae of atelectasis. Hepatobiliary: No focal liver abnormality is seen. Status post cholecystectomy. No biliary dilatation. Pancreas: Unremarkable. No pancreatic ductal dilatation or surrounding inflammatory changes. Spleen: Normal in size without focal abnormality. Adrenals/Urinary Tract: Adrenal glands are normal. Bilateral simple renal cysts are present with the largest located in the lower pole the left kidney measuring 7.5 cm. 2 mm nonobstructing calculus seen in the lower pole of the right kidney. 6 mm nonobstructing calculus present in the upper pole of the left kidney. No hydronephrosis or hydroureter. There is increased density of the bladder lumen. Stomach/Bowel: Large hiatal hernia. Moderate sigmoid colon diverticulosis without evidence of acute diverticulitis. No dilated loops of bowel to indicate ileus or obstruction. Appendix is not definitively identified. Vascular/Lymphatic: No enlarged lymph nodes.  Atherosclerotic calcifications seen throughout the abdominal aorta and visualized branches. Reproductive: Status post hysterectomy. No adnexal masses. Other: No abdominal wall hernia or abnormality. No abdominopelvic ascites. Musculoskeletal: Diffuse osteopenia. Mild compression deformity of the L1 vertebral body is unchanged from prior exam. IMPRESSION: 1. No acute abnormality of the abdomen or pelvis. 2. Nonobstructing bilateral renal calculi. No evidence of obstructive uropathy. 3. Sigmoid colon diverticulosis without evidence of acute diverticulitis. 4. Large hiatal hernia. 5. Hyperdense content of the bladder lumen may be due to contrast excreted from recent CT, however blood products can also have similar appearance. Please correlate with patient history for hematuria. Electronically Signed   By: Miachel Roux M.D.   On: 02/22/2022 14:45    Orson Eva, DO  [facility]  If 7PM-7AM, please contact night-coverage [email or web address] Password [facility] 02/23/2022, 7:38 AM   LOS: 1 day

## 2022-02-23 NOTE — Assessment & Plan Note (Addendum)
Patient has passed a right ureteral stone that was present on CT 02/15/2022 Continued ceftriaxone pending urine culture data Urine culture grew E coli Judicious opioids for pain D/c to SNF with cefdinir x 6 more days

## 2022-02-23 NOTE — Assessment & Plan Note (Deleted)
This has been a chronic issue Suspect IBS-D Check C. Difficile Stool pathogen panel Daughter requesting GI eval

## 2022-02-23 NOTE — Assessment & Plan Note (Addendum)
Patient is chronically on 2.5 L nasal cannula -Stable presently -received torsemide x 1 and -lasix 40 mg IV x 1

## 2022-02-23 NOTE — Care Management Important Message (Signed)
Important Message  Patient Details  Name: Jodi Webb MRN: [id number] Date of Birth: September 12, 1932   Medicare Important Message Given:  Other (see comment)     Tommy Medal 02/23/2022, 1:27 PM

## 2022-02-23 NOTE — Assessment & Plan Note (Addendum)
Baseline creatinine 0.5-0.6 Presented with serum creatinine 1.04 Secondary to volume depletion Initially fluid resuscitated>>developed fluid overload Given torsemide and lasix IV x 1 Serum creatinine 0.56 at time of d/c

## 2022-02-23 NOTE — Assessment & Plan Note (Signed)
Initially COVID-positive on 02/15/2022 Has finished 5 days Paxlovid Stable presently on her baseline 2.5 L nasal cannula

## 2022-02-23 NOTE — NC FL2 (Signed)
[place] LEVEL OF CARE SCREENING TOOL     IDENTIFICATION  Patient Name: Jodi Webb Birthdate: 1932-10-04 Sex: female Admission Date (Current Location): 02/22/2022  [facility] and [place] Number:  [employer] and Address:  [facility] [address], [place]      Provider Number: [id number]  Attending Physician Name and Address:  Orson Eva, MD  Relative Name and Phone Number:  Gaynelle Arabian (Daughter)   [phone]    Current Level of Care: Hospital Recommended Level of Care: [place] Prior Approval Number:    Date Approved/Denied:   PASRR Number:    Discharge Plan: SNF    Current Diagnoses: Patient Active Problem List   Diagnosis Date Noted   Acute pyelonephritis 02/23/2022   Diarrhea 02/23/2022   Intracranial hemorrhage ([place]) 02/22/2022   AKI (acute kidney injury) ([place]) 02/22/2022   COVID-19 virus infection 02/22/2022   Severe sepsis ([place]) 02/22/2022   Chronic respiratory failure with hypoxia ([place]) 04/02/2016   Hematochezia 04/01/2016   Acute blood loss anemia 04/01/2016   Controlled type 2 diabetes mellitus without complication, without long-term current use of insulin ([place]) 04/01/2016   Diverticulosis of colon 04/01/2016   Chronic UTI 04/01/2016   Colitis, infectious 03/31/2016   Essential hypertension    Renal disorder    Mixed hyperlipidemia    IBS (irritable bowel syndrome)    Diverticulosis    GERD (gastroesophageal reflux disease)     Orientation RESPIRATION BLADDER Height & Weight     Self, Time, Situation, Place  O2 (2.5 L) Continent Weight: 158 lb 15.2 oz (72.1 kg) Height:  5\' 1"  (154.9 cm)  BEHAVIORAL SYMPTOMS/MOOD NEUROLOGICAL BOWEL NUTRITION STATUS      Continent Diet (Regular)  AMBULATORY STATUS COMMUNICATION OF NEEDS Skin   Extensive Assist Verbally Normal                       Personal Care Assistance Level of Assistance  Bathing, Feeding, Dressing  Bathing Assistance: Limited assistance Feeding assistance: Independent Dressing Assistance: Limited assistance     Functional Limitations Info  Sight, Hearing, Speech Sight Info: Adequate Hearing Info: Adequate Speech Info: Adequate    SPECIAL CARE FACTORS FREQUENCY  PT (By licensed PT), OT (By licensed OT)     PT Frequency: 5 times weekly OT Frequency: 5 times weekly            Contractures Contractures Info: Not present    Additional Factors Info  Code Status, Allergies Code Status Info: DNR Allergies Info: Penicillins           Current Medications (02/23/2022):  This is the current hospital active medication list Current Facility-Administered Medications  Medication Dose Route Frequency Provider Last Rate Last Admin   0.9 %  sodium chloride infusion (Manually program via Guardrails IV Fluids)   Intravenous Once Tat, Shanon Brow, MD       0.9 % NaCl with KCl 20 mEq/ L  infusion   Intravenous Continuous Tat, Shanon Brow, MD       acetaminophen (TYLENOL) tablet 650 mg  650 mg Oral Q6H PRN Emokpae, Ejiroghene E, MD       Or   acetaminophen (TYLENOL) suppository 650 mg  650 mg Rectal Q6H PRN Emokpae, Ejiroghene E, MD       ALPRAZolam Duanne Moron) tablet 0.5 mg  0.5 mg Oral TID Emokpae, Ejiroghene E, MD   0.5 mg at 02/22/22 2125   cefTRIAXone (ROCEPHIN) 1 g in sodium chloride 0.9 %  100 mL IVPB  1 g Intravenous Q24H Emokpae, Ejiroghene E, MD 200 mL/hr at 02/22/22 2129 1 g at 02/22/22 2129   diltiazem (CARDIZEM CD) 24 hr capsule 240 mg  240 mg Oral Daily Emokpae, Ejiroghene E, MD       escitalopram (LEXAPRO) tablet 10 mg  10 mg Oral Daily Emokpae, Ejiroghene E, MD       ondansetron (ZOFRAN) tablet 4 mg  4 mg Oral Q6H PRN Emokpae, Ejiroghene E, MD       Or   ondansetron (ZOFRAN) injection 4 mg  4 mg Intravenous Q6H PRN Emokpae, Ejiroghene E, MD       oxyCODONE (Oxy IR/ROXICODONE) immediate release tablet 5 mg  5 mg Oral Q6H PRN Emokpae, Ejiroghene E, MD   5 mg at 02/22/22 2125   tamsulosin  (FLOMAX) capsule 0.4 mg  0.4 mg Oral Daily Emokpae, Ejiroghene E, MD   0.4 mg at 02/22/22 2125     Discharge Medications: Please see discharge summary for a list of discharge medications.  Relevant Imaging Results:  Relevant Lab Results:   Additional Information SSN: [id number], [place]

## 2022-02-23 NOTE — TOC Initial Note (Addendum)
Transition of Care [facility]) - Initial/Assessment Note    Patient Details  Name: Jodi Webb MRN: [id number] Date of Birth: 1932/02/25  [facility] CM/SW Contact:    Iona Beard, [place] Phone Number: 02/23/2022, 10:51 AM  Clinical Narrative:                 TOC updated that PT is recommending SNF for pt at D/C. CSW spoke with pt and pts daughter about this recommendation. They are agreeable to SNF. Pt would like CSW to call her later today to confirm but agreeable to referral being sent out. CSW spoke with pts daughter to explain that due to COVID+ status the only facility in the area is [facility]. Pts daughter is agreeable to this facility for SNF. CSW completed referral and sent out to [facility] for review. CSW completed pts PASRR, due to pt being out of state resident Rosalie Gums has been sent for manual review. TOC to follow.   Pts PASRR number is [id number].  Expected Discharge Plan: Skilled Nursing Facility Barriers to Discharge: Continued Medical Work up   Patient Goals and CMS Choice Patient states their goals for this hospitalization and ongoing recovery are:: Go to SNF CMS Medicare.gov Compare Post Acute Care list provided to:: Patient Choice offered to / list presented to : Patient, Adult Children  Expected Discharge Plan and Services Expected Discharge Plan: [place] In-house Referral: Clinical Social Work Discharge Planning Services: CM Consult Post Acute Care Choice: [place] arrangements for the past 2 months: [place]                                      Prior Living Arrangements/Services Living arrangements for the past 2 months: Single Family Home Lives with:: Adult Children Patient language and need for interpreter reviewed:: Yes Do you feel safe going back to the place where you live?: Yes      Need for Family Participation in Patient Care: Yes (Comment) Care giver support  system in place?: Yes (comment)   Criminal Activity/Legal Involvement Pertinent to Current Situation/Hospitalization: No - Comment as needed  Activities of Daily Living      Permission Sought/Granted                  Emotional Assessment Appearance:: Appears stated age Attitude/Demeanor/Rapport: Engaged Affect (typically observed): Accepting Orientation: : Oriented to Self, Oriented to Place, Oriented to  Time, Oriented to Situation Alcohol / Substance Use: Not Applicable Psych Involvement: No (comment)  Admission diagnosis:  SIRS (systemic inflammatory response syndrome) (HCC) [R65.10] AKI (acute kidney injury) ([place]) [N17.9] Severe sepsis ([place]) [A41.9, R65.20] Patient Active Problem List   Diagnosis Date Noted   Acute pyelonephritis 02/23/2022   Diarrhea 02/23/2022   Intracranial hemorrhage ([place]) 02/22/2022   AKI (acute kidney injury) ([place]) 02/22/2022   COVID-19 virus infection 02/22/2022   Severe sepsis ([place]) 02/22/2022   Chronic respiratory failure with hypoxia ([place]) 04/02/2016   Hematochezia 04/01/2016   Acute blood loss anemia 04/01/2016   Controlled type 2 diabetes mellitus without complication, without long-term current use of insulin ([place]) 04/01/2016   Diverticulosis of colon 04/01/2016   Chronic UTI 04/01/2016   Colitis, infectious 03/31/2016   Essential hypertension    Renal disorder    Mixed hyperlipidemia    IBS (irritable bowel syndrome)    Diverticulosis    GERD (gastroesophageal reflux disease)  PCP:  [name], DO Pharmacy:   [facility] [phone] - [place], [place] [address] Phone: [phone] Fax: [phone]     Social Determinants of Health (SDOH) Interventions    Readmission Risk Interventions Readmission Risk Prevention Plan 02/23/2022  Medication Screening Complete  Transportation Screening Complete  Some recent data  might be hidden

## 2022-02-23 NOTE — Assessment & Plan Note (Addendum)
Holding diltiazem, losartan secondary to soft blood pressure initially Restarted diltiazem Do not plan to restart losartan

## 2022-02-23 NOTE — Consult Note (Signed)
@LOGO @   Referring Provider: [facility]  Primary Care Physician:  [name], DO Primary Gastroenterologist:  Dr. Laural Golden  Date of Admission: 02/22/22 Date of Consultation: 02/23/22  Reason for Consultation:  Diarrhea  HPI:  Jodi Webb is a 88 y.o. year old female with a history of diabetes mellitus type 2, hypertension, COPD with chronic respiratory failure on 2.5 L, hyperlipidemia, COPD, chronic diarrhea though to be secondary to IBS, GERD, SDH following fall in October 2022 with neuro following with serial imaging for now, who presented to the emergency room on 2/23 with generalized weakness, fever, suprapubic pain and right flank pain.  She had also visited the emergency department at [facility] on 02/15/2022 with the aforementioned symptoms.  She was diagnosed with COVID at that time.  A CT of the abdomen and pelvis on 02/15/2022 did show a 0.4 cm calculus in the proximal third of the right ureter with some hydronephrosis and hydroureter.  The patient was subsequently discharged home from the ED with Paxlovid, Flomax, Lomotil, and oxycodone. Her symptoms did not improve.  She subsequently went to the emergency department in [place] on 02/20/2022.  She was sent home with a prescription for Cipro for which she took 4 doses (given 5 day Rx).  Due to persistent weakness, fever, and nausea, she returned to [facility] emergency room for further evaluation.  ED course: Hemodynamically stable, initial O2 saturation 79% on room air, temp 100.4. WBC 14.8, hemoglobin 8.6, platelets 205,000.  Sodium 135, potassium 3.7, bicarbonate 28, serum creatinine 1.04.  UA >50 WBCs COVID-19 positive.  CT renal stone protocol showed trace bilateral pleural effusions, 2 mm nonobstructive calculus in right kidney, 6 mm nonobstructive calculus in left kidney, increased density in urinary bladder. Started on ceftriaxone and IV fluids.  Chest x-ray yesterday evening with small bilateral pleural effusions  and left lower lobe infiltrate.  Today:  Spoke with patient's daughter over the phone who helped provide patient's history.  Patient is alert and oriented x3, but has some trouble remembering and may have a component of mild dementia.  They report chronic history of diarrhea, usually with 3-5 mushy, postprandial bowel movements per day, worsened by greasy/fatty items.  Previously was taking dicyclomine, but PCP started her on Lomotil a couple of weeks ago when she had acute worsening of diarrhea.  At that time, she was having about 15 mucus type bowel movements daily and also had some nocturnal stools at that time.  She later was diagnosed with COVID-19.  Mucousy stool and significant diarrhea has resolved, now back more consistent with her baseline.  Only taking Lomotil if she has more than 2 bowel movements a day which is not every day.  She does have some occasional incontinence, not knowing that she is going to have a bowel movement.  Reports chronic intermittent lower abdominal discomfort prior to a bowel movement that improves thereafter.  She is also been having some right flank pain radiating to the suprapubic area more recently in the setting of UTI and kidney stones.  Denies BRBPR or melena.  She has been on multiple antibiotics for recurrent UTIs.  She was on a maintenance dose of Bactrim Monday Wednesday and Friday.  She was on ciprofloxacin 2 months ago, Keflex for for 5 days, then again started on Cipro on Monday.  Admits to chronic daily nausea without vomiting. Denies typical heartburn. Rare dysphagia with associated regurgitation.  Present for at least 3 years.  Primarily with bread, meats. Hasn't occurred in 3 months.  Intermittent Has. [place] again in February- hit her head. No vision changes. Daughter reports last CT was on 2/6 in [place].  Still bleeding. Left side healed, right side a little worse.  Planning to repeat on 3/13.   No SOB. Chronic productive cough. No change.   Having fever and chills.  Had been having fever and chills.   Last colonoscopy: 2009 at [facility] with scattered diverticula in sigmoid and descending colon, small external hemorrhoids. Last EGD: None.    Past Medical History:  Diagnosis Date   Chronic diarrhea    Colitis, infectious 03/31/2016   Diabetes mellitus without complication (HCC)    Diverticulosis    GERD (gastroesophageal reflux disease)    Hyperlipemia    Hypertension    IBS (irritable bowel syndrome)    Renal disorder    kidney stones    Past Surgical History:  Procedure Laterality Date   ABDOMINAL HYSTERECTOMY     BACK SURGERY     CHOLECYSTECTOMY     COLONOSCOPY     2/1/2 year ago [name] , Dr.O'Neal   kidney stents     UPPER GASTROINTESTINAL ENDOSCOPY     2/1/2 year ago , [place], Dr.O'Neal.    Prior to Admission medications   Medication Sig Start Date End Date Taking? Authorizing Provider  acetaminophen (TYLENOL) 500 MG tablet Take 1,000 mg by mouth as needed.   Yes [provider]  ALPRAZolam Duanne Moron) 0.5 MG tablet Take 0.5 mg by mouth 3 (three) times daily.   Yes [provider]  atorvastatin (LIPITOR) 10 MG tablet Take 10 mg by mouth every morning.   Yes [provider]  ciprofloxacin (CIPRO) 250 MG tablet Take 250 mg by mouth 2 (two) times daily. 02/20/22  Yes [provider]  diltiazem (DILACOR XR) 240 MG 24 hr capsule Take 240 mg by mouth daily.   Yes [provider]  diphenoxylate-atropine (LOMOTIL) 2.5-0.025 MG tablet Take 2 tablets by mouth 4 (four) times daily as needed for diarrhea or loose stools. 02/15/22  Yes Kommor, Madison, MD  escitalopram (LEXAPRO) 10 MG tablet Take 10 mg by mouth daily.   Yes [provider]  furosemide (LASIX) 20 MG tablet Take 20 mg by mouth daily. On Mondays  Wednesday and fridays   Yes [provider]  hydrOXYzine (ATARAX) 10 MG tablet Take 10 mg by mouth 3 (three) times daily as  needed for itching.   Yes [provider]  loperamide (IMODIUM) 2 MG capsule Take 1 capsule (2 mg total) by mouth daily as needed for diarrhea or loose stools. 12/02/18  Yes Rehman, Mechele Dawley, MD  losartan (COZAAR) 25 MG tablet Take 25 mg by mouth daily.   Yes [provider]  ondansetron (ZOFRAN) 4 MG tablet Take 4 mg by mouth 2 (two) times daily.   Yes [provider]  oxyCODONE (ROXICODONE) 5 MG immediate release tablet Take 1 tablet (5 mg total) by mouth every 4 (four) hours as needed for severe pain. 02/15/22  Yes Kommor, Madison, MD  bisacodyl (DULCOLAX) 10 MG suppository Place 1 suppository (10 mg total) rectally as needed for moderate constipation. Patient not taking: Reported on 07/06/2020 12/02/18   Rogene Houston, MD  dicyclomine (BENTYL) 10 MG capsule Take 1 capsule (10 mg total) by mouth 3 (three) times daily as needed (diarrhea). Patient not taking: Reported on 02/15/2022 07/06/20   Laurine Blazer B, PA-C  pantoprazole (PROTONIX) 40 MG tablet Take 1 tablet (40 mg total) by mouth  every morning. Patient not taking: Reported on 02/15/2022 07/06/20   Ezzard Standing, PA-C    Current Facility-Administered Medications  Medication Dose Route Frequency Provider Last Rate Last Admin   0.9 %  sodium chloride infusion (Manually program via Guardrails IV Fluids)   Intravenous Once Tat, Shanon Brow, MD       0.9 % NaCl with KCl 20 mEq/ L  infusion   Intravenous Continuous Tat, Shanon Brow, MD 75 mL/hr at 02/23/22 1102 New Bag at 02/23/22 1102   acetaminophen (TYLENOL) tablet 650 mg  650 mg Oral Q6H PRN Emokpae, Ejiroghene E, MD       Or   acetaminophen (TYLENOL) suppository 650 mg  650 mg Rectal Q6H PRN Emokpae, Ejiroghene E, MD       ALPRAZolam (XANAX) tablet 0.5 mg  0.5 mg Oral TID Emokpae, Ejiroghene E, MD   0.5 mg at 02/23/22 1059   cefTRIAXone (ROCEPHIN) 1 g in sodium chloride 0.9 % 100 mL IVPB  1 g Intravenous Q24H Emokpae, Ejiroghene E, MD 200 mL/hr at 02/22/22 2129 1 g at  02/22/22 2129   cholestyramine light (PREVALITE) packet 4 g  4 g Oral Daily Dayven Linsley S, PA-C       diltiazem (CARDIZEM CD) 24 hr capsule 240 mg  240 mg Oral Daily Emokpae, Ejiroghene E, MD   240 mg at 02/23/22 1059   escitalopram (LEXAPRO) tablet 10 mg  10 mg Oral Daily Emokpae, Ejiroghene E, MD   10 mg at 02/23/22 1059   ondansetron (ZOFRAN) tablet 4 mg  4 mg Oral Q6H PRN Emokpae, Ejiroghene E, MD       Or   ondansetron (ZOFRAN) injection 4 mg  4 mg Intravenous Q6H PRN Emokpae, Ejiroghene E, MD       oxyCODONE (Oxy IR/ROXICODONE) immediate release tablet 5 mg  5 mg Oral Q6H PRN Emokpae, Ejiroghene E, MD   5 mg at 02/23/22 1059   pantoprazole (PROTONIX) EC tablet 40 mg  40 mg Oral Daily Dinisha Cai S, PA-C       tamsulosin (FLOMAX) capsule 0.4 mg  0.4 mg Oral Daily Emokpae, Ejiroghene E, MD   0.4 mg at 02/23/22 1100    Allergies as of 02/22/2022 - Review Complete 02/22/2022  Allergen Reaction Noted   Penicillins Shortness Of Breath and Rash 07/06/2020    Family History  Problem Relation Age of Onset   Ovarian cancer Mother    Heart disease Mother    Cirrhosis Father    Hip dysplasia Sister    ALS Sister    Arthritis Sister    Colon cancer Sister    Dementia Sister    Cirrhosis Daughter     Social History   Socioeconomic History   Marital status: Widowed    Spouse name: Not on file   Number of children: Not on file   Years of education: Not on file   Highest education level: Not on file  Occupational History   Not on file  Tobacco Use   Smoking status: Former    Types: Cigarettes    Quit date: 05/01/1976    Years since quitting: 45.8   Smokeless tobacco: Never  Substance and Sexual Activity   Alcohol use: No    Alcohol/week: 0.0 standard drinks   Drug use: No   Sexual activity: Not on file  Other Topics Concern   Not on file  Social History Narrative   Not on file   Social Determinants of Health   Financial Resource Strain:  Not on file  Food  Insecurity: Not on file  Transportation Needs: Not on file  Physical Activity: Not on file  Stress: Not on file  Social Connections: Not on file  Intimate Partner Violence: Not on file    Review of Systems: Gen: See HPI CV: Denies chest pain, heart palpitations. Resp: See HPI GI: See HPI Heme: See HPI  Physical Exam: Vital signs in last 24 hours: Temp:  [98.2 F (36.8 C)-100.4 F (38 C)] 98.2 F (36.8 C) (02/24 0442) Pulse Rate:  [55-73] 70 (02/24 0442) Resp:  [12-20] 18 (02/24 0442) BP: (102-134)/(44-59) 118/55 (02/24 0442) SpO2:  [79 %-99 %] 93 % (02/24 0442) Weight:  [72.1 kg] 72.1 kg (02/23 1813) Last BM Date : 02/22/22 General:   Elderly lady that is well-developed, well-nourished, pleasant and cooperative in NAD Head:  Normocephalic and atraumatic. Eyes:  Sclera clear, no icterus.   Conjunctiva pink. Ears:  Normal auditory acuity. Lungs:  Rhonchi appreciated in the anterior lung bilaterally, decreased breath sounds in the lung bases.  No wheezes, crackles. No acute distress. [place] in place on 2.5L.  Heart:  Regular rate and rhythm; no murmurs, clicks, rubs,  or gallops. Abdomen:  Soft, nondistended. Mild suprapubic TTP.  No masses, hepatosplenomegaly or hernias noted. Normal bowel sounds, without guarding, and without rebound.  Exam somewhat limited as patient was sitting in chair at bedside. Rectal:  Deferred Msk:  Symmetrical without gross deformities. Normal posture. Extremities:  Without edema. Neurologic:  Alert and  oriented x3.  Skin:  Intact without significant lesions or rashes. Psych:  Normal mood and affect.  Intake/Output from previous day: 02/23 0701 - 02/24 0700 In: 2433.5 [P.O.:240; IV Piggyback:2193.5] Out: -  Intake/Output this shift: No intake/output data recorded.  Lab Results: Recent Labs    02/22/22 1348 02/23/22 0551  WBC 14.8* 9.4  HGB 8.6* 7.0*  HCT 29.9* 25.5*  PLT 205 165   BMET Recent Labs    02/22/22 1348 02/23/22 0551  NA  135 136  K 3.7 3.8  CL 98 104  CO2 28 26  GLUCOSE 124* 110*  BUN 34* 28*  CREATININE 1.04* 0.71  CALCIUM 8.7* 8.2*   LFT Recent Labs    02/22/22 1348  PROT 6.5  ALBUMIN 2.8*  AST 19  ALT 15  ALKPHOS 49  BILITOT 0.5   PT/INR Recent Labs    02/22/22 1348  LABPROT 14.5  INR 1.1    Studies/Results: DG CHEST PORT 1 VIEW  Result Date: 02/22/2022 CLINICAL DATA:  Fever and weakness. EXAM: PORTABLE CHEST 1 VIEW COMPARISON:  Chest x-ray 04/02/2016 and CT scan abdomen/pelvis, same date. FINDINGS: The heart is mildly enlarged but stable. There is tortuosity and calcification of the thoracic aorta. Stable right paratracheal density. Moderate to large hiatal hernia. Small bilateral pleural effusions and left lower infiltrate better seen on the lung bases of the CT scan. IMPRESSION: 1. Small bilateral pleural effusions and left lower lobe infiltrate. 2. Stable hiatal hernia. Electronically Signed   By: Marijo Sanes M.D.   On: 02/22/2022 17:56   CT Renal Stone Study  Result Date: 02/22/2022 CLINICAL DATA:  Flank pain EXAM: CT ABDOMEN AND PELVIS WITHOUT CONTRAST TECHNIQUE: Multidetector CT imaging of the abdomen and pelvis was performed following the standard protocol without IV contrast. RADIATION DOSE REDUCTION: This exam was performed according to the departmental dose-optimization program which includes automated exposure control, adjustment of the mA and/or kV according to patient size and/or use of iterative reconstruction technique. COMPARISON:  CT abdomen pelvis contrast 02/15/2022 FINDINGS: Lower chest: Trace bilateral pleural effusions. Additional linear and strandy lung base opacities likely sequelae of atelectasis. Hepatobiliary: No focal liver abnormality is seen. Status post cholecystectomy. No biliary dilatation. Pancreas: Unremarkable. No pancreatic ductal dilatation or surrounding inflammatory changes. Spleen: Normal in size without focal abnormality. Adrenals/Urinary Tract:  Adrenal glands are normal. Bilateral simple renal cysts are present with the largest located in the lower pole the left kidney measuring 7.5 cm. 2 mm nonobstructing calculus seen in the lower pole of the right kidney. 6 mm nonobstructing calculus present in the upper pole of the left kidney. No hydronephrosis or hydroureter. There is increased density of the bladder lumen. Stomach/Bowel: Large hiatal hernia. Moderate sigmoid colon diverticulosis without evidence of acute diverticulitis. No dilated loops of bowel to indicate ileus or obstruction. Appendix is not definitively identified. Vascular/Lymphatic: No enlarged lymph nodes. Atherosclerotic calcifications seen throughout the abdominal aorta and visualized branches. Reproductive: Status post hysterectomy. No adnexal masses. Other: No abdominal wall hernia or abnormality. No abdominopelvic ascites. Musculoskeletal: Diffuse osteopenia. Mild compression deformity of the L1 vertebral body is unchanged from prior exam. IMPRESSION: 1. No acute abnormality of the abdomen or pelvis. 2. Nonobstructing bilateral renal calculi. No evidence of obstructive uropathy. 3. Sigmoid colon diverticulosis without evidence of acute diverticulitis. 4. Large hiatal hernia. 5. Hyperdense content of the bladder lumen may be due to contrast excreted from recent CT, however blood products can also have similar appearance. Please correlate with patient history for hematuria. Electronically Signed   By: Miachel Roux M.D.   On: 02/22/2022 14:45    Impression: 88 y.o. year old female with a history of diabetes mellitus type 2, hypertension, COPD with chronic respiratory failure on 2.5 L, hyperlipidemia, COPD, chronic diarrhea though to be secondary to IBS, GERD, SDH following fall in October 2022 with neuro following with serial imaging for now, diagnosed with COVID-19 on 2/16, who presented to the emergency room on 2/23 with generalized weakness, fever, suprapubic pain and right flank  pain, admitted with sepsis in the setting of UTI, pyelonephritis, ? left lung infiltrate on CXR, COVID-19, GI consulted for further evaluation of diarrhea. Patient was also found to have IDA.  Diarrhea:  Patient has chronic postprandial mushy bowel movements with associated intermittent mild lower abdominal cramping prior to bowel movement that improves thereafter, likely multifactorial in the setting of IBS and bile salt diarrhea.  She experienced acute on chronic diarrhea starting about 2 weeks ago with 15+ mucousy type bowel movements, occasional nocturnal stools, and some incontinence.  I suspect this was likely secondary to COVID-19 as she was diagnosed with COVID a few days later (2/16), and symptoms have now significantly improved, essentially back to baseline. Notably, C. difficile and GI pathogen panel negative on 2/16 and CT A/P with no significant small bowel or colonic findings. Her last colonoscopy was in 2009 with diverticula and external hemorrhoids.  We will plan to start her on cholestyramine 4 g daily.  We will check fecal lactoferrin, follow-up on stool studies as already ordered though I am less suspicious for infectious etiology, check TSH and celiac serologies. Can't rule out microscopic colitis as she has never had colon biopsies, but she is not an appropriate candidate for colonoscopy at this time.   IDA:  Appears patient's hemoglobin has been trending down over the last few months.  Chronically, it appears her hemoglobin has intermittently dipped down to the 11 range.  In October 2022 when she was admitted with subdural  hematoma and acute UTI, hemoglobin down to the 9 range.  Hemoglobin 10.1 when she was seen in the ED on 2/16, down to 8.6 on this admission, and down to 7.0 this morning. Iron 8, saturation 4%, ferritin 38.  No overt GI bleeding.  Denies hematuria, but she has had hemoglobin and RBCs on UA in the setting of UTIs and nephrolithiasis.  Last colonoscopy in 2009 with  diverticula and external hemorrhoids.  No prior EGD.  She has a known large hiatal hernia and reports chronic nausea without vomiting, 3-year history of intermittent solid food dysphagia with occasional regurgitation though none recently. Also with chronic diarrhea as per above. BUN is slightly elevated and does raise the question of upper GI bleeding, but this may also be secondary to mild bump in kidney function as well. It is also notable that her daughter reports she still has a slow brain bleed with most recent CT on 2/6 (in [place]) planning to monitor with repeat CT on 3/13.   At this time, would recommend transfusing as needed and monitoring. Patient is not a candidate for endoscopic evaluation right now. If transfusion dependent anemia, may need to consider. Will also check FOBT and start PPI daily due to history of GERD, daily nausea, and history of large hiatal hernia. Consider BPE if swallowing issues return.   Plan: Cholestyramine 4 g daily. TSH, IgA, ttg IgA, fecal lactoferrin. Follow-up on stool studies already ordered.  FOBT.  Protonix 40 mg daily. Transfuse for Hgb <7.  Not a candidate for endoscopic evaluation at this time. May need to consider if transfusion dependent anemia.  Will need to be started on iron supplementation, but can hold while inpatient unless given IV.    LOS: 1 day    02/23/2022, 12:23 PM   Aliene Altes, [facility]

## 2022-02-23 NOTE — Assessment & Plan Note (Addendum)
Present on admission Presented with leukocytosis, AKI, and fever due to urinary source UA >50WBC Continue ceftriaxone pending urine and blood culture data 02/22/2022 CT renal stone protocol as above Lactic acid 1.1 Sepsis physiology resolved

## 2022-02-23 NOTE — Evaluation (Signed)
Physical Therapy Evaluation Patient Details Name: Jodi Webb MRN: [id number] DOB: 1932/11/19 Today's Date: 02/23/2022  History of Present Illness  Jodi Webb is a 88 y.o. female with medical history significant for  DM, HTN, nephrolithiasis, chronic respiratory failure on 2 L.  Patient was brought to the ED with reports of fever and weakness for about a week.  Patient was seen here in the ED 2/16, with complaints of diarrhea, she was diagnosed with COVID-19, treated with Paxlovid.  Two days ago 2/21, patient was taken to the ED in [place] with reports of back pain, urinary symptoms, weakness and fevers, she was diagnosed with urinary tract infection, told she had a kidney stone in her ureter.  She was discharged on ciprofloxacin, but she has not been able to keep the medication down.   Clinical Impression  Patient limited for functional mobility as stated below secondary to BLE weakness, fatigue and poor standing balance. She requires min assist with bed mobility to pull to seated EOB and demonstrates good sitting tolerance and balance. She requires RW and assist to transfer to standing secondary to bilateral LE weakness and balance deficits. She ambulates several steps to transfer to [facility] and then to chair following. Patient overall limited by fatigue and ends session seated in chair with RN present. Patient will benefit from continued physical therapy in hospital and recommended venue below to increase strength, balance, endurance for safe ADLs and gait.        Recommendations for follow up therapy are one component of a multi-disciplinary discharge planning process, led by the attending physician.  Recommendations may be updated based on patient status, additional functional criteria and insurance authorization.  Follow Up Recommendations Skilled nursing-short term rehab (<3 hours/day)    Assistance Recommended at Discharge Intermittent Supervision/Assistance  Patient  can return home with the following  A little help with walking and/or transfers;A little help with bathing/dressing/bathroom;Help with stairs or ramp for entrance    Equipment Recommendations None recommended by PT  Recommendations for Other Services       Functional Status Assessment Patient has had a recent decline in their functional status and demonstrates the ability to make significant improvements in function in a reasonable and predictable amount of time.     Precautions / Restrictions Precautions Precautions: Fall Restrictions Weight Bearing Restrictions: No      Mobility  Bed Mobility Overal bed mobility: Needs Assistance Bed Mobility: Supine to Sit     Supine to sit: Min assist     General bed mobility comments: assist to pull to seated and for LE movement to EOB    Transfers Overall transfer level: Needs assistance Equipment used: Rolling walker (2 wheels) Transfers: Sit to/from Stand, Bed to chair/wheelchair/BSC Sit to Stand: Min assist Stand pivot transfers: Min assist         General transfer comment: slow, labored, assist for weakness with RW    Ambulation/Gait Ambulation/Gait assistance: Min assist Gait Distance (Feet): 5 Feet Assistive device: Rolling walker (2 wheels), 1 person hand held assist Gait Pattern/deviations: Decreased step length - right, Decreased step length - left Gait velocity: decreased     General Gait Details: slow, unsteady steps at bedside to ambulate to [facility] and then chair  Stairs            Wheelchair Mobility    Modified Rankin (Stroke Patients Only)       Balance Overall balance assessment: Needs assistance Sitting-balance support: Feet supported, No upper extremity supported Sitting balance-Leahy Scale: Good  Sitting balance - Comments: seated EOB   Standing balance support: Bilateral upper extremity supported Standing balance-Leahy Scale: Fair Standing balance comment: with RW                              Pertinent Vitals/Pain Pain Assessment Pain Assessment: No/denies pain    Home Living Family/patient expects to be discharged to:: Private residence Living Arrangements: Children Available Help at Discharge: Family Type of Home: House         Home Layout: Two level Home Equipment: Conservation officer, nature (2 wheels);Cane - single point      Prior Function Prior Level of Function : Needs assist             Mobility Comments: household ambulation with RW ADLs Comments: assisted by family recently     Hand Dominance        Extremity/Trunk Assessment   Upper Extremity Assessment Upper Extremity Assessment: Generalized weakness    Lower Extremity Assessment Lower Extremity Assessment: Generalized weakness    Cervical / Trunk Assessment Cervical / Trunk Assessment: Normal  Communication   Communication: No difficulties  Cognition Arousal/Alertness: Awake/alert Behavior During Therapy: WFL for tasks assessed/performed, Flat affect Overall Cognitive Status: Within Functional Limits for tasks assessed                                          General Comments      Exercises     Assessment/Plan    PT Assessment Patient needs continued PT services  PT Problem List Decreased strength;Decreased mobility;Decreased activity tolerance;Decreased balance       PT Treatment Interventions DME instruction;Therapeutic exercise;Gait training;Balance training;Stair training;Neuromuscular re-education;Functional mobility training;Patient/family education;Therapeutic activities    PT Goals (Current goals can be found in the Care Plan section)  Acute Rehab PT Goals Patient Stated Goal: Return home PT Goal Formulation: With patient Time For Goal Achievement: 03/09/22 Potential to Achieve Goals: Good    Frequency Min 3X/week     Co-evaluation               AM-PAC PT "6 Clicks" Mobility  Outcome Measure Help needed turning from your  back to your side while in a flat bed without using bedrails?: A Little Help needed moving from lying on your back to sitting on the side of a flat bed without using bedrails?: A Little Help needed moving to and from a bed to a chair (including a wheelchair)?: A Lot Help needed standing up from a chair using your arms (e.g., wheelchair or bedside chair)?: A Little Help needed to walk in hospital room?: A Lot Help needed climbing 3-5 steps with a railing? : A Lot 6 Click Score: 15    End of Session Equipment Utilized During Treatment: Gait belt;Oxygen Activity Tolerance: Patient tolerated treatment well;Patient limited by fatigue Patient left: in chair;with call bell/phone within reach;with nursing/sitter in room Nurse Communication: Mobility status PT Visit Diagnosis: Unsteadiness on feet (R26.81);Other abnormalities of gait and mobility (R26.89);Muscle weakness (generalized) (M62.81)    Time: 0350-0938 PT Time Calculation (min) (ACUTE ONLY): 28 min   Charges:   PT Evaluation $PT Eval Low Complexity: 1 Low PT Treatments $Therapeutic Activity: 23-37 mins        9:00 AM, 02/23/22 Mearl Latin PT, DPT Physical Therapist at [facility]

## 2022-02-24 ENCOUNTER — Encounter (HOSPITAL_COMMUNITY): Payer: Self-pay | Admitting: Internal Medicine

## 2022-02-24 DIAGNOSIS — D509 Iron deficiency anemia, unspecified: Secondary | ICD-10-CM | POA: Diagnosis not present

## 2022-02-24 DIAGNOSIS — N179 Acute kidney failure, unspecified: Secondary | ICD-10-CM | POA: Diagnosis not present

## 2022-02-24 DIAGNOSIS — N10 Acute tubulo-interstitial nephritis: Secondary | ICD-10-CM | POA: Diagnosis not present

## 2022-02-24 DIAGNOSIS — A419 Sepsis, unspecified organism: Secondary | ICD-10-CM | POA: Diagnosis not present

## 2022-02-24 DIAGNOSIS — A0472 Enterocolitis due to Clostridium difficile, not specified as recurrent: Secondary | ICD-10-CM | POA: Diagnosis not present

## 2022-02-24 DIAGNOSIS — R110 Nausea: Secondary | ICD-10-CM | POA: Diagnosis not present

## 2022-02-24 DIAGNOSIS — R197 Diarrhea, unspecified: Secondary | ICD-10-CM | POA: Diagnosis not present

## 2022-02-24 DIAGNOSIS — D508 Other iron deficiency anemias: Secondary | ICD-10-CM

## 2022-02-24 LAB — BASIC METABOLIC PANEL
Anion gap: 5 (ref 5–15)
BUN: 21 mg/dL (ref 8–23)
CO2: 26 mmol/L (ref 22–32)
Calcium: 8.7 mg/dL — ABNORMAL LOW (ref 8.9–10.3)
Chloride: 109 mmol/L (ref 98–111)
Creatinine, Ser: 0.59 mg/dL (ref 0.44–1.00)
GFR, Estimated: >60 mL/min (ref >60)
Glucose, Bld: 119 mg/dL — ABNORMAL HIGH (ref 70–99)
Potassium: 4.2 mmol/L (ref 3.5–5.1)
Sodium: 140 mmol/L (ref 135–145)

## 2022-02-24 LAB — CBC
HCT: 31.9 % — ABNORMAL LOW (ref 36.0–46.0)
Hemoglobin: 9.4 g/dL — ABNORMAL LOW (ref 12.0–15.0)
MCH: 26.9 pg (ref 26.0–34.0)
MCHC: 29.5 g/dL — ABNORMAL LOW (ref 30.0–36.0)
MCV: 91.4 fL (ref 80.0–100.0)
Platelets: 175 10*3/uL (ref 150–400)
RBC: 3.49 MIL/uL — ABNORMAL LOW (ref 3.87–5.11)
RDW: 15.5 % (ref 11.5–15.5)
WBC: 7.4 10*3/uL (ref 4.0–10.5)
nRBC: 0 % (ref 0.0–0.2)

## 2022-02-24 LAB — IGA: IgA: 298 mg/dL (ref 64–422)

## 2022-02-24 LAB — MAGNESIUM: Magnesium: 1.8 mg/dL (ref 1.7–2.4)

## 2022-02-24 LAB — GASTROINTESTINAL PANEL BY PCR, STOOL (REPLACES STOOL CULTURE)

## 2022-02-24 LAB — GLUCOSE, CAPILLARY
Glucose-Capillary: 113 mg/dL — ABNORMAL HIGH (ref 70–99)
Glucose-Capillary: 112 mg/dL — ABNORMAL HIGH (ref 70–99)
Glucose-Capillary: 151 mg/dL — ABNORMAL HIGH (ref 70–99)
Glucose-Capillary: 122 mg/dL — ABNORMAL HIGH (ref 70–99)

## 2022-02-24 MED ORDER — VANCOMYCIN HCL 125 MG PO CAPS
125.0000 mg | ORAL_CAPSULE | Freq: Four times a day (QID) | ORAL | Status: DC
  Administered 2022-02-24 – 2022-02-26 (×10): 125 mg via ORAL
  Filled 2022-02-24 (×18): qty 1

## 2022-02-24 MED ORDER — CEFDINIR 300 MG PO CAPS
300.0000 mg | ORAL_CAPSULE | Freq: Once | ORAL | Status: AC
  Administered 2022-02-24: 300 mg via ORAL
  Filled 2022-02-24: qty 1

## 2022-02-24 MED ORDER — SODIUM CHLORIDE 0.9 % IV SOLN
250.0000 mg | Freq: Once | INTRAVENOUS | Status: AC
  Administered 2022-02-24: 250 mg via INTRAVENOUS
  Filled 2022-02-24: qty 20

## 2022-02-24 MED ORDER — SODIUM CHLORIDE 0.9% FLUSH: 10.0000 mL | INTRAVENOUS | Status: DC | PRN

## 2022-02-24 MED ORDER — TORSEMIDE 20 MG PO TABS
40.0000 mg | ORAL_TABLET | Freq: Once | ORAL | Status: AC
  Administered 2022-02-24: 40 mg via ORAL
  Filled 2022-02-24: qty 2

## 2022-02-24 MED ORDER — FUROSEMIDE 10 MG/ML IJ SOLN: 40.0000 mg | Freq: Once | INTRAMUSCULAR | Status: DC

## 2022-02-24 MED ORDER — SODIUM CHLORIDE 0.9% FLUSH
10.0000 mL | Freq: Two times a day (BID) | INTRAVENOUS | Status: DC
  Administered 2022-02-24 – 2022-02-26 (×4): 10 mL

## 2022-02-24 NOTE — Progress Notes (Signed)
Subjective:  Patient says she has good appetite.  However she is only been eating few bites every time she gets a meal according to her daughter Lenna Sciara who was here earlier today.  She denies nausea vomiting or heartburn.  Patient says stools are still loose.  She describes them to be medium volume.  She is at 4 today.  No melena or rectal bleeding reported.  Current Medications:  Current Facility-Administered Medications:    acetaminophen (TYLENOL) tablet 650 mg, 650 mg, Oral, Q6H PRN **OR** acetaminophen (TYLENOL) suppository 650 mg, 650 mg, Rectal, Q6H PRN, Emokpae, Ejiroghene E, MD   ALPRAZolam (XANAX) tablet 0.5 mg, 0.5 mg, Oral, TID, Emokpae, Ejiroghene E, MD, 0.5 mg at 02/24/22 1016   cefTRIAXone (ROCEPHIN) 1 g in sodium chloride 0.9 % 100 mL IVPB, 1 g, Intravenous, Q24H, Emokpae, Ejiroghene E, MD, Last Rate: 200 mL/hr at 02/23/22 2114, 1 g at 02/23/22 2114   cholestyramine light (PREVALITE) packet 4 g, 4 g, Oral, Daily, Harper, Kristen S, PA-C, 4 g at 02/24/22 1016   diltiazem (CARDIZEM CD) 24 hr capsule 240 mg, 240 mg, Oral, Daily, Emokpae, Ejiroghene E, MD, 240 mg at 02/24/22 1016   escitalopram (LEXAPRO) tablet 10 mg, 10 mg, Oral, Daily, Emokpae, Ejiroghene E, MD, 10 mg at 02/24/22 1016   ondansetron (ZOFRAN) tablet 4 mg, 4 mg, Oral, Q6H PRN **OR** ondansetron (ZOFRAN) injection 4 mg, 4 mg, Intravenous, Q6H PRN, Emokpae, Ejiroghene E, MD   oxyCODONE (Oxy IR/ROXICODONE) immediate release tablet 5 mg, 5 mg, Oral, Q6H PRN, Emokpae, Ejiroghene E, MD, 5 mg at 02/23/22 1909   pantoprazole (PROTONIX) EC tablet 40 mg, 40 mg, Oral, Daily, Harper, Kristen S, PA-C, 40 mg at 02/24/22 1016   tamsulosin (FLOMAX) capsule 0.4 mg, 0.4 mg, Oral, Daily, Emokpae, Ejiroghene E, MD, 0.4 mg at 02/24/22 1016   vancomycin (VANCOCIN) capsule 125 mg, 125 mg, Oral, QID, Tat, David, MD, 125 mg at 02/24/22 1333   Objective: Blood pressure (!) 158/60, pulse 72, temperature 98.5 F (36.9 C), temperature source  Oral, resp. rate 19, height _0  (1.549 m), weight 72.1 kg, SpO2 94 %.  Patient appears to be pale. Abdominal examinations within normal limits.  Labs/studies Results:   CBC Latest Ref Rng & Units 02/24/2022 02/23/2022 02/23/2022  WBC 4.0 - 10.5 K/uL 7.4 - 9.4  Hemoglobin 12.0 - 15.0 g/dL 9.4(L) 9.1(L) 7.0(L)  Hematocrit 36.0 - 46.0 % 31.9(L) 31.8(L) 25.5(L)  Platelets 150 - 400 K/uL 175 - 165    CMP Latest Ref Rng & Units 02/24/2022 02/23/2022 02/22/2022  Glucose 70 - 99 mg/dL 119(H) 110(H) 124(H)  BUN 8 - 23 mg/dL 21 28(H) 34(H)  Creatinine 0.44 - 1.00 mg/dL 0.59 0.71 1.04(H)  Sodium 135 - 145 mmol/L 140 136 135  Potassium 3.5 - 5.1 mmol/L 4.2 3.8 3.7  Chloride 98 - 111 mmol/L 109 104 98  CO2 22 - 32 mmol/L _1 Calcium 8.9 - 10.3 mg/dL 8.7(L) 8.2(L) 8.7(L)  Total Protein 6.5 - 8.1 g/dL - - 6.5  Total Bilirubin 0.3 - 1.2 mg/dL - - 0.5  Alkaline Phos 38 - 126 U/L - - 49  AST 15 - 41 U/L - - 19  ALT 0 - 44 U/L - - 15    Hepatic Function Latest Ref Rng & Units 02/22/2022 02/15/2022 03/31/2016  Total Protein 6.5 - 8.1 g/dL 6.5 7.2 7.8  Albumin 3.5 - 5.0 g/dL 2.8(L) 4.0 4.2  AST 15 - 41 U/L _2 ALT  0 - 44 U/L _0 Alk Phosphatase 38 - 126 U/L 49 61 80  Total Bilirubin 0.3 - 1.2 mg/dL 0.5 0.6 0.5    Lab Results  Component Value Date   CRP 12.2 (H) 02/23/2022    Blood cultures negative on day 2. GI pathogen panel negative. C. difficile by PCR positive. Serum IgA is 298 Tissue transglutaminase IgA antibody pending.   Assessment:  #1.  Acute on chronic diarrhea.  C. difficile by PCR is positive.  Stool study was negative on 02/15/2022.  Given ongoing diarrhea it would be appropriate to treat her with oral vancomycin as discussed with Dr. Carles Collet. We will continue cholestyramine at current dose of 4 g daily.  #2.  Iron deficiency anemia.  Her stool was heme positive.  She has not had any overt GI bleed.  Anemia appears to be multifactorial.  She received 2 units of  PRBCs yesterday.  May consider work-up at a later date possibly an outpatient basis unless she has evidence of melena or rectal bleeding during this admission.  #3.  Pulmonary infiltrate.  Patient is on IV ceftriaxone.  She was COVID-positive on 02/15/2022.    #4.  Chronic GERD.  She has known large hiatal hernia.  PPI therapy in this situation would be appropriate even though she has C. difficile colitis.  I believe benefit outweighs the risk.   Recommendations  We will continue to monitor H&H. Continue vancomycin at current dose for total of 10 days. Will asked nursing staff to assist her to reclining chair.

## 2022-02-24 NOTE — Assessment & Plan Note (Addendum)
Also Suspect IBS-D Check C. Difficile-->antigen and PCR positive>>treat in light of abd pain and worsen diarrhea>>improved on vancomycin po Stool pathogen panel--neg Daughter requesting GI eval>>discussed with Dr. Laural Golden Plan 7 more days po vancomycin after d/c Continue cholestyramine

## 2022-02-24 NOTE — Assessment & Plan Note (Addendum)
Iron saturation 4, ferritin 38 Transfused nulecit 2/25 Started ferrous sulfate

## 2022-02-24 NOTE — Progress Notes (Signed)
PROGRESS NOTE  Jodi Webb PXT:[id number] DOB: [birth year] DOA: 02/22/2022 PCP: [name], DO  Brief History:  88 year old female with a history of diabetes mellitus type 2, hypertension, chronic respiratory failure on 2.5 L, hyperlipidemia, COPD, and chronic diarrhea presenting with 2-week history of generalized weakness, fevers, suprapubic pain and right flank pain.  The patient visited the emergency department at [facility] on 02/15/2022 with the aforementioned symptoms.  She was diagnosed with COVID at that time.  A CT of the abdomen and pelvis on 02/15/2022 did show a 0.4 cm calculus in the proximal third of the right ureter with some hydronephrosis.  The patient was subsequently discharged home from the ED with Paxlovid, Flomax, Lomotil, and oxycodone.  Her symptoms did not improve.  She subsequently went to the emergency department in [place] on 02/20/2022.  She was sent home with a prescription for Cipro for which she took 4 doses.  She was given a 5-day prescription.  Unfortunately, she continued to be weaker with persistent fevers and nausea.  As result, she presented for further evaluation and treatment on 02/22/2022.  She denies any headache, visual disturbance, neck pain, chest pain, worsening shortness of breath, coughing, hemoptysis.  She does have dysuria without any hematuria.  There is no hematochezia or melena.  The patient was started on ceftriaxone.  CT renal stone protocol showed trace bilateral pleural effusions.  There was a 2 mm nonobstructive calculus on the right kidney, 6 mm nonobstructive calculus in the left kidney.  There was increased density in the urinary bladder.  WBC 14.8, hemoglobin 8.6, platelets 205,000.  Sodium 135, potassium 3.7, bicarbonate 28, serum creatinine 1.04.  The patient was started on ceftriaxone and IV fluids.   Assessment/Plan:   Principal Problem:   Severe sepsis (HCC) Active Problems:   Acute pyelonephritis   AKI (acute kidney  injury) ([place])   COVID-19 virus infection   Chronic respiratory failure with hypoxia (HCC)   Essential hypertension   Mixed hyperlipidemia   Controlled type 2 diabetes mellitus without complication, without long-term current use of insulin (HCC)   Intracranial hemorrhage (HCC)   Iron deficiency anemia   C. difficile colitis  Assessment and Plan: * Severe sepsis ([place])- (present on admission) Present on admission Presented with leukocytosis, AKI, and fever due to urinary source UA >50WBC Continue ceftriaxone pending urine and blood culture data 02/22/2022 CT renal stone protocol as above Lactic acid 1.1  Acute pyelonephritis Patient has passed a right ureteral stone that was present on CT 02/15/2022 Continue ceftriaxone pending urine culture data Judicious opioids for pain  AKI (acute kidney injury) ([place])- (present on admission) Baseline creatinine 0.5-0.6 Presented with serum creatinine 1.04 Secondary to volume depletion Continue IV fluids>>saline lock  COVID-19 virus infection- (present on admission) Initially COVID-positive on 02/15/2022 Has finished 5 days Paxlovid Stable presently on her baseline 2.5 L nasal cannula  Chronic respiratory failure with hypoxia ([place]) Patient is chronically on 2.5 L nasal cannula -Stable presently -lasix 40 mg IV x 1  C. difficile colitis Also Suspect IBS-D Check C. Difficile-->antigen and PCR positive>>treat in light of abd pain and worsen diarrhea Stool pathogen panel--neg Daughter requesting GI eval>>discussed with Dr. Laural Golden Continue cholestyramine  Iron deficiency anemia Iron saturation 4, ferritin 38 Transfuse nulecit  Intracranial hemorrhage [facility]) Patient follows with neurosurgery at [facility] She has frequent falls Recent CT on 02/05/2022 at OSH showed increased right SDH Patient has opted for surveillance CTs and observation Neck CT scheduled  for 03/12/2022  Controlled type 2 diabetes mellitus without complication, without  long-term current use of insulin (HCC) Check A1c--5/7 Allow for liberal glycemic control at this point given poor oral intake and FTT Not on any agents at home  Mixed hyperlipidemia Continue statin  Essential hypertension- (present on admission) Holding diltiazem, losartan secondary to soft blood pressure      Status is: Inpatient Remains inpatient appropriate because: severity of illness requiring IV abx and IVF and not tolerating diet                       Family Communication:   daughter updated 2/24   Consultants:  GI   Code Status:  DNR   DVT Prophylaxis:  [place] Ds     Procedures: As Listed in Progress Note Above   Antibiotics: Ceftriaxone 2/23>>           Subjective: Abdominal pain is a little better today.  She feels sob.  Denies cp, n/v.  Diarrhea persists but a little better.  No headache.  Objective: Vitals:   02/23/22 1806 02/23/22 2108 02/24/22 0508 02/24/22 1422  BP: (!) 132/51 (!) 147/61 (!) 146/58 (!) 158/60  Pulse: 61 65 63 72  Resp: 18 18 18 19   Temp: 98.8 F (37.1 C) 98.9 F (37.2 C) 99.3 F (37.4 C) 98.5 F (36.9 C)  TempSrc:  Oral Oral Oral  SpO2:  94% 95% 94%  Weight:      Height:        Intake/Output Summary (Last 24 hours) at 02/24/2022 1700 Last data filed at 02/24/2022 1300 Gross per 24 hour  Intake 1371.83 ml  Output --  Net 1371.83 ml   Weight change:  Exam:  General:  Pt is alert, follows commands appropriately, not in acute distress HEENT: No icterus, No thrush, No neck mass, [place]/AT Cardiovascular: RRR, S1/S2, no rubs, no gallops Respiratory: bilateral rales.  Bibasilar wheeze Abdomen: Soft/+BS, non tender, non distended, no guarding Extremities: Nonpitting edema, No lymphangitis, No petechiae, No rashes, no synovitis   Data Reviewed: I have personally reviewed following labs and imaging studies Basic Metabolic Panel: Recent Labs  Lab 02/22/22 1348 02/23/22 0551 02/24/22 0515  NA 135 136 140  K 3.7  3.8 4.2  CL 98 104 109  CO2 28 26 26   GLUCOSE 124* 110* 119*  BUN 34* 28* 21  CREATININE 1.04* 0.71 0.59  CALCIUM 8.7* 8.2* 8.7*  MG  --   --  1.8   Liver Function Tests: Recent Labs  Lab 02/22/22 1348  AST 19  ALT 15  ALKPHOS 49  BILITOT 0.5  PROT 6.5  ALBUMIN 2.8*   No results for input(s): LIPASE, AMYLASE in the last 168 hours. No results for input(s): AMMONIA in the last 168 hours. Coagulation Profile: Recent Labs  Lab 02/22/22 1348  INR 1.1   CBC: Recent Labs  Lab 02/22/22 1348 02/23/22 0551 02/23/22 2322 02/24/22 0515  WBC 14.8* 9.4  --  7.4  NEUTROABS 13.1*  --   --   --   HGB 8.6* 7.0* 9.1* 9.4*  HCT 29.9* 25.5* 31.8* 31.9*  MCV 89.5 89.8  --  91.4  PLT 205 165  --  175   Cardiac Enzymes: No results for input(s): CKTOTAL, CKMB, CKMBINDEX, TROPONINI in the last 168 hours. BNP: Invalid input(s): POCBNP CBG: Recent Labs  Lab 02/23/22 0732 02/23/22 1126 02/23/22 1650 02/24/22 0751 02/24/22 1145  GLUCAP 114* 156* 147* 113* 112*   HbA1C: Recent  Labs    02/23/22 0551  HGBA1C 5.7*   Urine analysis:    Component Value Date/Time   COLORURINE YELLOW 02/22/2022 1332   APPEARANCEUR CLOUDY (A) 02/22/2022 1332   LABSPEC 1.016 02/22/2022 1332   PHURINE 5.0 02/22/2022 1332   GLUCOSEU NEGATIVE 02/22/2022 1332   HGBUR SMALL (A) 02/22/2022 1332   BILIRUBINUR NEGATIVE 02/22/2022 1332   KETONESUR NEGATIVE 02/22/2022 1332   PROTEINUR 30 (A) 02/22/2022 1332   NITRITE POSITIVE (A) 02/22/2022 1332   LEUKOCYTESUR LARGE (A) 02/22/2022 1332   Sepsis Labs: @LABRCNTIP (procalcitonin:4,lacticidven:4) ) Recent Results (from the past 240 hour(s))  Resp Panel by RT-PCR (Flu A&B, Covid) Nasopharyngeal Swab     Status: Abnormal   Collection Time: 02/15/22  8:34 AM   Specimen: Nasopharyngeal Swab; Nasopharyngeal(NP) swabs in vial transport medium  Result Value Ref Range Status   SARS Coronavirus 2 by RT PCR POSITIVE (A) NEGATIVE Final    Comment:  (NOTE) SARS-CoV-2 target nucleic acids are DETECTED.  The SARS-CoV-2 RNA is generally detectable in upper respiratory specimens during the acute phase of infection. Positive results are indicative of the presence of the identified virus, but do not rule out bacterial infection or co-infection with other pathogens not detected by the test. Clinical correlation with patient history and other diagnostic information is necessary to determine patient infection status. The expected result is Negative.  Fact Sheet for Patients: EntrepreneurPulse.com.au  Fact Sheet for Healthcare Providers: IncredibleEmployment.be  This test is not yet approved or cleared by the [place] FDA and  has been authorized for detection and/or diagnosis of SARS-CoV-2 by FDA under an Emergency Use Authorization (EUA).  This EUA will remain in effect (meaning this test can be used) for the duration of  the COVID-19 declaration under Section 564(b)(1) of the A ct, 21 U.S.C. section 360bbb-3(b)(1), unless the authorization is terminated or revoked sooner.     Influenza A by PCR NEGATIVE NEGATIVE Final   Influenza B by PCR NEGATIVE NEGATIVE Final    Comment: (NOTE) The Xpert Xpress SARS-CoV-2/FLU/RSV plus assay is intended as an aid in the diagnosis of influenza from Nasopharyngeal swab specimens and should not be used as a sole basis for treatment. Nasal washings and aspirates are unacceptable for Xpert Xpress SARS-CoV-2/FLU/RSV testing.  Fact Sheet for Patients: EntrepreneurPulse.com.au  Fact Sheet for Healthcare Providers: IncredibleEmployment.be  This test is not yet approved or cleared by the [place] FDA and has been authorized for detection and/or diagnosis of SARS-CoV-2 by FDA under an Emergency Use Authorization (EUA). This EUA will remain in effect (meaning this test can be used) for the duration of the COVID-19  declaration under Section 564(b)(1) of the Act, 21 U.S.C. section 360bbb-3(b)(1), unless the authorization is terminated or revoked.  Performed at [facility], [address] [place], [place] [address]   C Difficile Quick Screen w PCR reflex     Status: None   Collection Time: 02/15/22  8:35 AM   Specimen: STOOL  Result Value Ref Range Status   C Diff antigen NEGATIVE NEGATIVE Final   C Diff toxin NEGATIVE NEGATIVE Final   C Diff interpretation No C. difficile detected.  Final    Comment: Performed at [facility], [address] [place], [place] [address]  Gastrointestinal Panel by PCR , Stool     Status: None   Collection Time: 02/15/22  8:35 AM   Specimen: STOOL  Result Value Ref Range Status   Campylobacter species NOT DETECTED NOT DETECTED Final   Plesimonas shigelloides  NOT DETECTED NOT DETECTED Final   Salmonella species NOT DETECTED NOT DETECTED Final   Yersinia enterocolitica NOT DETECTED NOT DETECTED Final   Vibrio species NOT DETECTED NOT DETECTED Final   Vibrio cholerae NOT DETECTED NOT DETECTED Final   Enteroaggregative E coli (EAEC) NOT DETECTED NOT DETECTED Final   Enteropathogenic E coli (EPEC) NOT DETECTED NOT DETECTED Final   Enterotoxigenic E coli (ETEC) NOT DETECTED NOT DETECTED Final   Shiga like toxin producing E coli (STEC) NOT DETECTED NOT DETECTED Final   Shigella/Enteroinvasive E coli (EIEC) NOT DETECTED NOT DETECTED Final   Cryptosporidium NOT DETECTED NOT DETECTED Final   Cyclospora cayetanensis NOT DETECTED NOT DETECTED Final   Entamoeba histolytica NOT DETECTED NOT DETECTED Final   Giardia lamblia NOT DETECTED NOT DETECTED Final   Adenovirus F40/41 NOT DETECTED NOT DETECTED Final   Astrovirus NOT DETECTED NOT DETECTED Final   Norovirus GI/GII NOT DETECTED NOT DETECTED Final   Rotavirus A NOT DETECTED NOT DETECTED Final   Sapovirus (I, II, IV, and V) NOT DETECTED NOT DETECTED Final    Comment: Performed at [facility], [address] [place], [place] [address]  Resp Panel by RT-PCR (Flu A&B, Covid) Nasopharyngeal Swab     Status: Abnormal   Collection Time: 02/22/22  1:32 PM   Specimen: Nasopharyngeal Swab; Nasopharyngeal(NP) swabs in vial transport medium  Result Value Ref Range Status   SARS Coronavirus 2 by RT PCR POSITIVE (A) NEGATIVE Final    Comment: (NOTE) SARS-CoV-2 target nucleic acids are DETECTED.  The SARS-CoV-2 RNA is generally detectable in upper respiratory specimens during the acute phase of infection. Positive results are indicative of the presence of the identified virus, but do not rule out bacterial infection or co-infection with other pathogens not detected by the test. Clinical correlation with patient history and other diagnostic information is necessary to determine patient infection status. The expected result is Negative.  Fact Sheet for Patients: EntrepreneurPulse.com.au  Fact Sheet for Healthcare Providers: IncredibleEmployment.be  This test is not yet approved or cleared by the [place] FDA and  has been authorized for detection and/or diagnosis of SARS-CoV-2 by FDA under an Emergency Use Authorization (EUA).  This EUA will remain in effect (meaning this test can be used) for the duration of  the COVID-19 declaration under Section 564(b)(1) of the A ct, 21 U.S.C. section 360bbb-3(b)(1), unless the authorization is terminated or revoked sooner.     Influenza A by PCR NEGATIVE NEGATIVE Final   Influenza B by PCR NEGATIVE NEGATIVE Final    Comment: (NOTE) The Xpert Xpress SARS-CoV-2/FLU/RSV plus assay is intended as an aid in the diagnosis of influenza from Nasopharyngeal swab specimens and should not be used as a sole basis for treatment. Nasal washings and aspirates are unacceptable for Xpert Xpress SARS-CoV-2/FLU/RSV testing.  Fact Sheet for Patients: EntrepreneurPulse.com.au  Fact Sheet for Healthcare  Providers: IncredibleEmployment.be  This test is not yet approved or cleared by the [place] FDA and has been authorized for detection and/or diagnosis of SARS-CoV-2 by FDA under an Emergency Use Authorization (EUA). This EUA will remain in effect (meaning this test can be used) for the duration of the COVID-19 declaration under Section 564(b)(1) of the Act, 21 U.S.C. section 360bbb-3(b)(1), unless the authorization is terminated or revoked.  Performed at [facility], [address] [place], [place] [address]   Urine Culture     Status: Abnormal (Preliminary result)   Collection Time: 02/22/22  1:32 PM   Specimen: Urine,  Catheterized  Result Value Ref Range Status   Specimen Description   Final    URINE, CATHETERIZED Performed at [facility], [address], [place], [place] [address]    Special Requests   Final    NONE Performed at [facility], [address], [place], [place] [address]    Culture (A)  Final    >=100,000 COLONIES/mL ESCHERICHIA COLI SUSCEPTIBILITIES TO FOLLOW Performed at [facility] [address] [place], [place] [address]    Report Status PENDING  Incomplete  Blood Culture (routine x 2)     Status: None (Preliminary result)   Collection Time: 02/22/22  1:49 PM   Specimen: BLOOD LEFT ARM  Result Value Ref Range Status   Specimen Description BLOOD LEFT ARM  Final   Special Requests   Final    BOTTLES DRAWN AEROBIC AND ANAEROBIC Blood Culture adequate volume   Culture   Final    NO GROWTH 2 DAYS Performed at [facility], [address], [place], [place] [address]    Report Status PENDING  Incomplete  Blood Culture (routine x 2)     Status: None (Preliminary result)   Collection Time: 02/22/22  1:49 PM   Specimen: BLOOD RIGHT ARM  Result Value Ref Range Status   Specimen Description BLOOD RIGHT ARM  Final   Special Requests   Final    BOTTLES DRAWN AEROBIC AND ANAEROBIC Blood Culture adequate volume   Culture   Final     NO GROWTH 2 DAYS Performed at [facility], [address], [place], [place] [address]    Report Status PENDING  Incomplete  Gastrointestinal Panel by PCR , Stool     Status: None   Collection Time: 02/23/22  7:35 AM   Specimen: STOOL  Result Value Ref Range Status   Campylobacter species NOT DETECTED NOT DETECTED Final   Plesimonas shigelloides NOT DETECTED NOT DETECTED Final   Salmonella species NOT DETECTED NOT DETECTED Final   Yersinia enterocolitica NOT DETECTED NOT DETECTED Final   Vibrio species NOT DETECTED NOT DETECTED Final   Vibrio cholerae NOT DETECTED NOT DETECTED Final   Enteroaggregative E coli (EAEC) NOT DETECTED NOT DETECTED Final   Enteropathogenic E coli (EPEC) NOT DETECTED NOT DETECTED Final   Enterotoxigenic E coli (ETEC) NOT DETECTED NOT DETECTED Final   Shiga like toxin producing E coli (STEC) NOT DETECTED NOT DETECTED Final   Shigella/Enteroinvasive E coli (EIEC) NOT DETECTED NOT DETECTED Final   Cryptosporidium NOT DETECTED NOT DETECTED Final   Cyclospora cayetanensis NOT DETECTED NOT DETECTED Final   Entamoeba histolytica NOT DETECTED NOT DETECTED Final   Giardia lamblia NOT DETECTED NOT DETECTED Final   Adenovirus F40/41 NOT DETECTED NOT DETECTED Final   Astrovirus NOT DETECTED NOT DETECTED Final   Norovirus GI/GII NOT DETECTED NOT DETECTED Final   Rotavirus A NOT DETECTED NOT DETECTED Final   Sapovirus (I, II, IV, and V) NOT DETECTED NOT DETECTED Final    Comment: Performed at [facility], [address] [place], [place] [address]  C Difficile Quick Screen w PCR reflex     Status: Abnormal   Collection Time: 02/23/22  7:36 AM   Specimen: STOOL  Result Value Ref Range Status   C Diff antigen POSITIVE (A) NEGATIVE Final   C Diff toxin NEGATIVE NEGATIVE Final   C Diff interpretation Results are indeterminate. See PCR results.  Final    Comment: Performed at [facility], [address], [place],  [address]  C. Diff by PCR, Reflexed      Status: Abnormal   Collection Time: 02/23/22  7:36 AM  Result Value Ref Range Status   Toxigenic C. Difficile by PCR POSITIVE (A) NEGATIVE Final    Comment: Positive for toxigenic C. difficile with little to no toxin production. Only treat if clinical presentation suggests symptomatic illness. Performed at [facility], [place] [address] [place], [place] [address]      Scheduled Meds:  ALPRAZolam  0.5 mg Oral TID   cholestyramine light  4 g Oral Daily   diltiazem  240 mg Oral Daily   escitalopram  10 mg Oral Daily   furosemide  40 mg Intravenous Once   pantoprazole  40 mg Oral Daily   tamsulosin  0.4 mg Oral Daily   vancomycin  125 mg Oral QID   Continuous Infusions:  cefTRIAXone (ROCEPHIN)  IV 1 g (02/23/22 2114)   ferric gluconate (FERRLECIT) IVPB      Procedures/Studies: CT ABDOMEN PELVIS W CONTRAST  Result Date: 02/15/2022 CLINICAL DATA:  Right lower quadrant abdominal pain, chronic diarrhea worsening recently EXAM: CT ABDOMEN AND PELVIS WITH CONTRAST TECHNIQUE: Multidetector CT imaging of the abdomen and pelvis was performed using the standard protocol following bolus administration of intravenous contrast. RADIATION DOSE REDUCTION: This exam was performed according to the departmental dose-optimization program which includes automated exposure control, adjustment of the mA and/or kV according to patient size and/or use of iterative reconstruction technique. CONTRAST:  139mL OMNIPAQUE IOHEXOL 300 MG/ML  SOLN COMPARISON:  03/31/2016 FINDINGS: Lower chest: No acute abnormality. Large hiatal hernia with intrathoracic position of the gastric body and fundus. Hepatobiliary: No focal liver abnormality is seen. Status post cholecystectomy. Mild postoperative biliary dilatation. Pancreas: Unremarkable. No pancreatic ductal dilatation or surrounding inflammatory changes. Spleen: Normal in size without significant abnormality. Adrenals/Urinary Tract: Adrenal glands are unremarkable. There  is a 0.4 cm calculus in the proximal third of the right ureter with mild associated hydronephrosis and hydroureter (series 2, image 44). Additional small bilateral nonobstructive calculi. Multiple simple, benign bilateral renal cortical cysts. Bladder is unremarkable. Stomach/Bowel: Stomach is within normal limits. Appendix is not clearly visualized and may be surgically absent. No evidence of bowel wall thickening, distention, or inflammatory changes. Sigmoid diverticula. Vascular/Lymphatic: Aortic atherosclerosis. No enlarged abdominal or pelvic lymph nodes. Reproductive: Status post hysterectomy. Other: No abdominal wall hernia or abnormality. No ascites. Musculoskeletal: There is a new, although age indeterminate superior endplate wedge deformity of the L1 vertebral body (series 6, image 70) IMPRESSION: 1. There is a 0.4 cm calculus in the proximal third of the right ureter with mild associated hydronephrosis and hydroureter. Additional small bilateral nonobstructive calculi. 2. Large hiatal hernia with intrathoracic position of the gastric body and fundus. 3. New, although age indeterminate superior endplate wedge deformity of the L1 vertebral body. Correlate for acutely referable pain and point tenderness. 4. Sigmoid diverticulosis without evidence of acute diverticulitis. Aortic Atherosclerosis (ICD10-I70.0). Electronically Signed   By: Delanna Ahmadi M.D.   On: 02/15/2022 11:04   DG CHEST PORT 1 VIEW  Result Date: 02/22/2022 CLINICAL DATA:  Fever and weakness. EXAM: PORTABLE CHEST 1 VIEW COMPARISON:  Chest x-ray 04/02/2016 and CT scan abdomen/pelvis, same date. FINDINGS: The heart is mildly enlarged but stable. There is tortuosity and calcification of the thoracic aorta. Stable right paratracheal density. Moderate to large hiatal hernia. Small bilateral pleural effusions and left lower infiltrate better seen on the lung bases of the CT scan. IMPRESSION: 1. Small bilateral pleural  effusions and left lower  lobe infiltrate. 2. Stable hiatal hernia. Electronically Signed   By: Marijo Sanes M.D.   On: 02/22/2022 17:56   CT Renal Stone Study  Result Date: 02/22/2022 CLINICAL DATA:  Flank pain EXAM: CT ABDOMEN AND PELVIS WITHOUT CONTRAST TECHNIQUE: Multidetector CT imaging of the abdomen and pelvis was performed following the standard protocol without IV contrast. RADIATION DOSE REDUCTION: This exam was performed according to the departmental dose-optimization program which includes automated exposure control, adjustment of the mA and/or kV according to patient size and/or use of iterative reconstruction technique. COMPARISON:  CT abdomen pelvis contrast 02/15/2022 FINDINGS: Lower chest: Trace bilateral pleural effusions. Additional linear and strandy lung base opacities likely sequelae of atelectasis. Hepatobiliary: No focal liver abnormality is seen. Status post cholecystectomy. No biliary dilatation. Pancreas: Unremarkable. No pancreatic ductal dilatation or surrounding inflammatory changes. Spleen: Normal in size without focal abnormality. Adrenals/Urinary Tract: Adrenal glands are normal. Bilateral simple renal cysts are present with the largest located in the lower pole the left kidney measuring 7.5 cm. 2 mm nonobstructing calculus seen in the lower pole of the right kidney. 6 mm nonobstructing calculus present in the upper pole of the left kidney. No hydronephrosis or hydroureter. There is increased density of the bladder lumen. Stomach/Bowel: Large hiatal hernia. Moderate sigmoid colon diverticulosis without evidence of acute diverticulitis. No dilated loops of bowel to indicate ileus or obstruction. Appendix is not definitively identified. Vascular/Lymphatic: No enlarged lymph nodes. Atherosclerotic calcifications seen throughout the abdominal aorta and visualized branches. Reproductive: Status post hysterectomy. No adnexal masses. Other: No abdominal wall hernia or abnormality. No abdominopelvic ascites.  Musculoskeletal: Diffuse osteopenia. Mild compression deformity of the L1 vertebral body is unchanged from prior exam. IMPRESSION: 1. No acute abnormality of the abdomen or pelvis. 2. Nonobstructing bilateral renal calculi. No evidence of obstructive uropathy. 3. Sigmoid colon diverticulosis without evidence of acute diverticulitis. 4. Large hiatal hernia. 5. Hyperdense content of the bladder lumen may be due to contrast excreted from recent CT, however blood products can also have similar appearance. Please correlate with patient history for hematuria. Electronically Signed   By: Miachel Roux M.D.   On: 02/22/2022 14:45    Orson Eva, DO  [facility]  If 7PM-7AM, please contact night-coverage [email or web address] Password [email or web address] 02/24/2022, 5:00 PM   LOS: 2 days

## 2022-02-25 DIAGNOSIS — N10 Acute tubulo-interstitial nephritis: Secondary | ICD-10-CM | POA: Diagnosis not present

## 2022-02-25 DIAGNOSIS — J9611 Chronic respiratory failure with hypoxia: Secondary | ICD-10-CM | POA: Diagnosis not present

## 2022-02-25 DIAGNOSIS — A0472 Enterocolitis due to Clostridium difficile, not specified as recurrent: Secondary | ICD-10-CM | POA: Diagnosis not present

## 2022-02-25 DIAGNOSIS — A419 Sepsis, unspecified organism: Secondary | ICD-10-CM | POA: Diagnosis not present

## 2022-02-25 LAB — COMPREHENSIVE METABOLIC PANEL
ALT: 14 U/L (ref 0–44)
AST: 21 U/L (ref 15–41)
Albumin: 2.5 g/dL — ABNORMAL LOW (ref 3.5–5.0)
Alkaline Phosphatase: 47 U/L (ref 38–126)
Anion gap: 11 (ref 5–15)
BUN: 13 mg/dL (ref 8–23)
CO2: 29 mmol/L (ref 22–32)
Calcium: 8.7 mg/dL — ABNORMAL LOW (ref 8.9–10.3)
Chloride: 101 mmol/L (ref 98–111)
Creatinine, Ser: 0.54 mg/dL (ref 0.44–1.00)
GFR, Estimated: >60 mL/min (ref >60)
Glucose, Bld: 108 mg/dL — ABNORMAL HIGH (ref 70–99)
Potassium: 3.7 mmol/L (ref 3.5–5.1)
Sodium: 141 mmol/L (ref 135–145)
Total Bilirubin: 0.6 mg/dL (ref 0.3–1.2)
Total Protein: 6 g/dL — ABNORMAL LOW (ref 6.5–8.1)

## 2022-02-25 LAB — CBC
HCT: 35.9 % — ABNORMAL LOW (ref 36.0–46.0)
Hemoglobin: 10.8 g/dL — ABNORMAL LOW (ref 12.0–15.0)
MCH: 27.1 pg (ref 26.0–34.0)
MCHC: 30.1 g/dL (ref 30.0–36.0)
MCV: 90.2 fL (ref 80.0–100.0)
Platelets: 192 10*3/uL (ref 150–400)
RBC: 3.98 MIL/uL (ref 3.87–5.11)
RDW: 15.2 % (ref 11.5–15.5)
WBC: 6.6 10*3/uL (ref 4.0–10.5)
nRBC: 0 % (ref 0.0–0.2)

## 2022-02-25 LAB — TYPE AND SCREEN
ABO/RH(D): B POS
Antibody Screen: NEGATIVE
Unit division: 0
Unit division: 0

## 2022-02-25 LAB — GLUCOSE, CAPILLARY
Glucose-Capillary: 105 mg/dL — ABNORMAL HIGH (ref 70–99)
Glucose-Capillary: 129 mg/dL — ABNORMAL HIGH (ref 70–99)

## 2022-02-25 LAB — C-REACTIVE PROTEIN: CRP: 3.4 mg/dL — ABNORMAL HIGH (ref <1.0)

## 2022-02-25 LAB — URINE CULTURE: Culture: >=100000 — AB

## 2022-02-25 LAB — TISSUE TRANSGLUTAMINASE, IGA: Tissue Transglutaminase Ab, IgA: <2 U/mL (ref 0–3)

## 2022-02-25 LAB — T4, FREE: Free T4: 1.2 ng/dL — ABNORMAL HIGH (ref 0.61–1.12)

## 2022-02-25 LAB — BPAM RBC
Blood Product Expiration Date: 202304032359
Blood Product Expiration Date: 202304032359
ISSUE DATE / TIME: 202302241451
ISSUE DATE / TIME: 202302241742
Unit Type and Rh: 5100
Unit Type and Rh: 5100

## 2022-02-25 LAB — FERRITIN: Ferritin: 125 ng/mL (ref 11–307)

## 2022-02-25 MED ORDER — FUROSEMIDE 10 MG/ML IJ SOLN
40.0000 mg | Freq: Once | INTRAMUSCULAR | Status: AC
Start: 2022-02-25 — End: 2022-02-25
  Administered 2022-02-25: 40 mg via INTRAVENOUS
  Filled 2022-02-25: qty 4

## 2022-02-25 MED ORDER — NYSTATIN 100000 UNIT/GM EX POWD
Freq: Two times a day (BID) | CUTANEOUS | Status: DC
  Filled 2022-02-25: qty 15

## 2022-02-25 MED ORDER — FERROUS SULFATE 325 (65 FE) MG PO TABS
325.0000 mg | ORAL_TABLET | Freq: Every day | ORAL | Status: DC
  Administered 2022-02-26: 325 mg via ORAL
  Filled 2022-02-25: qty 1

## 2022-02-25 MED ORDER — CEFDINIR 300 MG PO CAPS
300.0000 mg | ORAL_CAPSULE | Freq: Two times a day (BID) | ORAL | Status: DC
  Administered 2022-02-26: 300 mg via ORAL
  Filled 2022-02-25: qty 1

## 2022-02-25 NOTE — Progress Notes (Signed)
PROGRESS NOTE  Jodi Webb KVQ:[id number] DOB: 17-Oct-1932 DOA: 02/22/2022 PCP: [name], DO  Brief History:  88 year old female with a history of diabetes mellitus type 2, hypertension, chronic respiratory failure on 2.5 L, hyperlipidemia, COPD, and chronic diarrhea presenting with 2-week history of generalized weakness, fevers, suprapubic pain and right flank pain.  The patient visited the emergency department at [facility] on 02/15/2022 with the aforementioned symptoms.  She was diagnosed with COVID at that time.  A CT of the abdomen and pelvis on 02/15/2022 did show a 0.4 cm calculus in the proximal third of the right ureter with some hydronephrosis.  The patient was subsequently discharged home from the ED with Paxlovid, Flomax, Lomotil, and oxycodone.  Her symptoms did not improve.  She subsequently went to the emergency department in [place] on 02/20/2022.  She was sent home with a prescription for Cipro for which she took 4 doses.  She was given a 5-day prescription.  Unfortunately, she continued to be weaker with persistent fevers and nausea.  As result, she presented for further evaluation and treatment on 02/22/2022.  She denies any headache, visual disturbance, neck pain, chest pain, worsening shortness of breath, coughing, hemoptysis.  She does have dysuria without any hematuria.  There is no hematochezia or melena.  The patient was started on ceftriaxone.  CT renal stone protocol showed trace bilateral pleural effusions.  There was a 2 mm nonobstructive calculus on the right kidney, 6 mm nonobstructive calculus in the left kidney.  There was increased density in the urinary bladder.  WBC 14.8, hemoglobin 8.6, platelets 205,000.  Sodium 135, potassium 3.7, bicarbonate 28, serum creatinine 1.04.  The patient was started on ceftriaxone and IV fluids.   Assessment/Plan:   Principal Problem:   Severe sepsis (HCC) Active Problems:   Acute pyelonephritis   AKI (acute kidney  injury) ([place])   COVID-19 virus infection   Chronic respiratory failure with hypoxia (HCC)   Essential hypertension   Mixed hyperlipidemia   Controlled type 2 diabetes mellitus without complication, without long-term current use of insulin (HCC)   Intracranial hemorrhage (HCC)   Iron deficiency anemia   C. difficile colitis  Assessment and Plan: * Severe sepsis ([place])- (present on admission) Present on admission Presented with leukocytosis, AKI, and fever due to urinary source UA >50WBC Continue ceftriaxone pending urine and blood culture data 02/22/2022 CT renal stone protocol as above Lactic acid 1.1 Sepsis physiology resolved  Acute pyelonephritis Patient has passed a right ureteral stone that was present on CT 02/15/2022 Continued ceftriaxone pending urine culture data Judicious opioids for pain D/c to SNF with cefdinir x 6 more days  AKI (acute kidney injury) ([place])- (present on admission) Baseline creatinine 0.5-0.6 Presented with serum creatinine 1.04 Secondary to volume depletion Continue IV fluids>>saline lock  COVID-19 virus infection- (present on admission) Initially COVID-positive on 02/15/2022 Has finished 5 days Paxlovid Stable presently on her baseline 2.5 L nasal cannula  Chronic respiratory failure with hypoxia ([place]) Patient is chronically on 2.5 L nasal cannula -Stable presently -lasix 40 mg IV x 1  C. difficile colitis Also Suspect IBS-D Check C. Difficile-->antigen and PCR positive>>treat in light of abd pain and worsen diarrhea Stool pathogen panel--neg Daughter requesting GI eval>>discussed with Dr. Laural Golden Continue cholestyramine  Iron deficiency anemia Iron saturation 4, ferritin 38 Transfused nulecit 2/25 Start ferrous sulfate  Intracranial hemorrhage [facility]) Patient follows with neurosurgery at [facility] She has frequent falls Recent CT on 02/05/2022 at  OSH showed increased right SDH Patient has opted for surveillance CTs and observation Neck  CT scheduled for 03/12/2022  Controlled type 2 diabetes mellitus without complication, without long-term current use of insulin (HCC) Check A1c--5/7 Allow for liberal glycemic control at this point given poor oral intake and FTT Not on any agents at home  Mixed hyperlipidemia Continue statin  Essential hypertension- (present on admission) Holding diltiazem, losartan secondary to soft blood pressure initially Restarted diltiazem   Status is: Inpatient Remains inpatient appropriate because: severity of illness requiring IV abx and IVF and not tolerating diet                       Family Communication:   daughter updated 2/26   Consultants:  GI   Code Status:  DNR   DVT Prophylaxis:  SCDs     Procedures: As Listed in Progress Note Above   Antibiotics: Ceftriaxone 2/23>>2/26 Cefdinir 2/27>>   Total time spent 50 minutes.  Greater than 50% spent face to face counseling and coordinating care.                   Subjective: Patient denies fevers, chills, headache, chest pain, dyspnea, nausea, vomiting, diarrhea, abdominal pain, dysuria, hematuria, hematochezia, and melena.   Objective: Vitals:   02/24/22 1422 02/24/22 2114 02/25/22 0519 02/25/22 1417  BP: (!) 158/60 (!) 131/59 (!) 158/63 (!) 144/51  Pulse: 72 63 67 66  Resp: 19 19 19 18   Temp: 98.5 F (36.9 C) 98.9 F (37.2 C) 99.1 F (37.3 C) 98.5 F (36.9 C)  TempSrc: Oral Oral Oral Oral  SpO2: 94% 96% 95% 94%  Weight:      Height:        Intake/Output Summary (Last 24 hours) at 02/25/2022 1735 Last data filed at 02/25/2022 1300 Gross per 24 hour  Intake 800 ml  Output 2052 ml  Net -1252 ml   Weight change:  Exam:  General:  Pt is alert, follows commands appropriately, not in acute distress HEENT: No icterus, No thrush, No neck mass, [place]/AT Cardiovascular: RRR, S1/S2, no rubs, no gallops Respiratory: bibasilar rales.  No wheeze Abdomen: Soft/+BS, non tender, non distended, no  guarding Extremities: Nonpitting edema, No lymphangitis, No petechiae, No rashes, no synovitis   Data Reviewed: I have personally reviewed following labs and imaging studies Basic Metabolic Panel: Recent Labs  Lab 02/22/22 1348 02/23/22 0551 02/24/22 0515 02/25/22 0559  NA 135 136 140 141  K 3.7 3.8 4.2 3.7  CL 98 104 109 101  CO2 28 26 26 29   GLUCOSE 124* 110* 119* 108*  BUN 34* 28* 21 13  CREATININE 1.04* 0.71 0.59 0.54  CALCIUM 8.7* 8.2* 8.7* 8.7*  MG  --   --  1.8  --    Liver Function Tests: Recent Labs  Lab 02/22/22 1348 02/25/22 0559  AST 19 21  ALT 15 14  ALKPHOS 49 47  BILITOT 0.5 0.6  PROT 6.5 6.0*  ALBUMIN 2.8* 2.5*   No results for input(s): LIPASE, AMYLASE in the last 168 hours. No results for input(s): AMMONIA in the last 168 hours. Coagulation Profile: Recent Labs  Lab 02/22/22 1348  INR 1.1   CBC: Recent Labs  Lab 02/22/22 1348 02/23/22 0551 02/23/22 2322 02/24/22 0515 02/25/22 0708  WBC 14.8* 9.4  --  7.4 6.6  NEUTROABS 13.1*  --   --   --   --   HGB 8.6* 7.0* 9.1* 9.4* 10.8*  HCT  29.9* 25.5* 31.8* 31.9* 35.9*  MCV 89.5 89.8  --  91.4 90.2  PLT 205 165  --  175 192   Cardiac Enzymes: No results for input(s): CKTOTAL, CKMB, CKMBINDEX, TROPONINI in the last 168 hours. BNP: Invalid input(s): POCBNP CBG: Recent Labs  Lab 02/24/22 1145 02/24/22 1700 02/24/22 2119 02/25/22 0718 02/25/22 1116  GLUCAP 112* 151* 122* 105* 129*   HbA1C: Recent Labs    02/23/22 0551  HGBA1C 5.7*   Urine analysis:    Component Value Date/Time   COLORURINE YELLOW 02/22/2022 1332   APPEARANCEUR CLOUDY (A) 02/22/2022 1332   LABSPEC 1.016 02/22/2022 1332   PHURINE 5.0 02/22/2022 1332   GLUCOSEU NEGATIVE 02/22/2022 1332   HGBUR SMALL (A) 02/22/2022 1332   BILIRUBINUR NEGATIVE 02/22/2022 1332   KETONESUR NEGATIVE 02/22/2022 1332   PROTEINUR 30 (A) 02/22/2022 1332   NITRITE POSITIVE (A) 02/22/2022 1332   LEUKOCYTESUR LARGE (A) 02/22/2022 1332    Sepsis Labs: @LABRCNTIP (procalcitonin:4,lacticidven:4) ) Recent Results (from the past 240 hour(s))  Resp Panel by RT-PCR (Flu A&B, Covid) Nasopharyngeal Swab     Status: Abnormal   Collection Time: 02/22/22  1:32 PM   Specimen: Nasopharyngeal Swab; Nasopharyngeal(NP) swabs in vial transport medium  Result Value Ref Range Status   SARS Coronavirus 2 by RT PCR POSITIVE (A) NEGATIVE Final    Comment: (NOTE) SARS-CoV-2 target nucleic acids are DETECTED.  The SARS-CoV-2 RNA is generally detectable in upper respiratory specimens during the acute phase of infection. Positive results are indicative of the presence of the identified virus, but do not rule out bacterial infection or co-infection with other pathogens not detected by the test. Clinical correlation with patient history and other diagnostic information is necessary to determine patient infection status. The expected result is Negative.  Fact Sheet for Patients: EntrepreneurPulse.com.au  Fact Sheet for Healthcare Providers: IncredibleEmployment.be  This test is not yet approved or cleared by the [place] FDA and  has been authorized for detection and/or diagnosis of SARS-CoV-2 by FDA under an Emergency Use Authorization (EUA).  This EUA will remain in effect (meaning this test can be used) for the duration of  the COVID-19 declaration under Section 564(b)(1) of the A ct, 21 U.S.C. section 360bbb-3(b)(1), unless the authorization is terminated or revoked sooner.     Influenza A by PCR NEGATIVE NEGATIVE Final   Influenza B by PCR NEGATIVE NEGATIVE Final    Comment: (NOTE) The Xpert Xpress SARS-CoV-2/FLU/RSV plus assay is intended as an aid in the diagnosis of influenza from Nasopharyngeal swab specimens and should not be used as a sole basis for treatment. Nasal washings and aspirates are unacceptable for Xpert Xpress SARS-CoV-2/FLU/RSV testing.  Fact Sheet for  Patients: EntrepreneurPulse.com.au  Fact Sheet for Healthcare Providers: IncredibleEmployment.be  This test is not yet approved or cleared by the [place] FDA and has been authorized for detection and/or diagnosis of SARS-CoV-2 by FDA under an Emergency Use Authorization (EUA). This EUA will remain in effect (meaning this test can be used) for the duration of the COVID-19 declaration under Section 564(b)(1) of the Act, 21 U.S.C. section 360bbb-3(b)(1), unless the authorization is terminated or revoked.  Performed at [facility], [address], [place], [place] [address]   Urine Culture     Status: Abnormal   Collection Time: 02/22/22  1:32 PM   Specimen: Urine, Catheterized  Result Value Ref Range Status   Specimen Description   Final    URINE, CATHETERIZED Performed at [facility], [address],  [place], [place] [address]    Special Requests   Final    NONE Performed at [facility], [address] [place], [place] [address]    Culture >=100,000 COLONIES/mL ESCHERICHIA COLI (A)  Final   Report Status 02/25/2022 FINAL  Final   Organism ID, Bacteria ESCHERICHIA COLI (A)  Final      Susceptibility   Escherichia coli - MIC*    AMPICILLIN <=2 SENSITIVE Sensitive     CEFAZOLIN <=4 SENSITIVE Sensitive     CEFEPIME <=0.12 SENSITIVE Sensitive     CEFTRIAXONE <=0.25 SENSITIVE Sensitive     CIPROFLOXACIN >=4 RESISTANT Resistant     GENTAMICIN <=1 SENSITIVE Sensitive     IMIPENEM <=0.25 SENSITIVE Sensitive     NITROFURANTOIN 64 INTERMEDIATE Intermediate     TRIMETH/SULFA <=20 SENSITIVE Sensitive     AMPICILLIN/SULBACTAM <=2 SENSITIVE Sensitive     PIP/TAZO <=4 SENSITIVE Sensitive     * >=100,000 COLONIES/mL ESCHERICHIA COLI  Blood Culture (routine x 2)     Status: None (Preliminary result)   Collection Time: 02/22/22  1:49 PM   Specimen: BLOOD LEFT ARM  Result Value Ref Range Status   Specimen Description BLOOD LEFT ARM  Final    Special Requests   Final    BOTTLES DRAWN AEROBIC AND ANAEROBIC Blood Culture adequate volume   Culture   Final    NO GROWTH 2 DAYS Performed at [facility], [address], [place], [place] [address]    Report Status PENDING  Incomplete  Blood Culture (routine x 2)     Status: None (Preliminary result)   Collection Time: 02/22/22  1:49 PM   Specimen: BLOOD RIGHT ARM  Result Value Ref Range Status   Specimen Description BLOOD RIGHT ARM  Final   Special Requests   Final    BOTTLES DRAWN AEROBIC AND ANAEROBIC Blood Culture adequate volume   Culture   Final    NO GROWTH 2 DAYS Performed at [facility], [address], [place], [place] [address]    Report Status PENDING  Incomplete  Gastrointestinal Panel by PCR , Stool     Status: None   Collection Time: 02/23/22  7:35 AM   Specimen: STOOL  Result Value Ref Range Status   Campylobacter species NOT DETECTED NOT DETECTED Final   Plesimonas shigelloides NOT DETECTED NOT DETECTED Final   Salmonella species NOT DETECTED NOT DETECTED Final   Yersinia enterocolitica NOT DETECTED NOT DETECTED Final   Vibrio species NOT DETECTED NOT DETECTED Final   Vibrio cholerae NOT DETECTED NOT DETECTED Final   Enteroaggregative E coli (EAEC) NOT DETECTED NOT DETECTED Final   Enteropathogenic E coli (EPEC) NOT DETECTED NOT DETECTED Final   Enterotoxigenic E coli (ETEC) NOT DETECTED NOT DETECTED Final   Shiga like toxin producing E coli (STEC) NOT DETECTED NOT DETECTED Final   Shigella/Enteroinvasive E coli (EIEC) NOT DETECTED NOT DETECTED Final   Cryptosporidium NOT DETECTED NOT DETECTED Final   Cyclospora cayetanensis NOT DETECTED NOT DETECTED Final   Entamoeba histolytica NOT DETECTED NOT DETECTED Final   Giardia lamblia NOT DETECTED NOT DETECTED Final   Adenovirus F40/41 NOT DETECTED NOT DETECTED Final   Astrovirus NOT DETECTED NOT DETECTED Final   Norovirus GI/GII NOT DETECTED NOT DETECTED Final   Rotavirus A NOT DETECTED NOT DETECTED Final    Sapovirus (I, II, IV, and V) NOT DETECTED NOT DETECTED Final    Comment: Performed at [facility], [address] [place], [place] [address]  C Difficile Quick Screen  w PCR reflex     Status: Abnormal   Collection Time: 02/23/22  7:36 AM   Specimen: STOOL  Result Value Ref Range Status   C Diff antigen POSITIVE (A) NEGATIVE Final   C Diff toxin NEGATIVE NEGATIVE Final   C Diff interpretation Results are indeterminate. See PCR results.  Final    Comment: Performed at [facility], [address], [place], [place] [address]  C. Diff by PCR, Reflexed     Status: Abnormal   Collection Time: 02/23/22  7:36 AM  Result Value Ref Range Status   Toxigenic C. Difficile by PCR POSITIVE (A) NEGATIVE Final    Comment: Positive for toxigenic C. difficile with little to no toxin production. Only treat if clinical presentation suggests symptomatic illness. Performed at [facility], [place] [address], [place],  [address]      Scheduled Meds:  ALPRAZolam  0.5 mg Oral TID   [START ON 02/26/2022] cefdinir  300 mg Oral Q12H   cholestyramine light  4 g Oral Daily   diltiazem  240 mg Oral Daily   escitalopram  10 mg Oral Daily   [START ON 02/26/2022] ferrous sulfate  325 mg Oral Q breakfast   pantoprazole  40 mg Oral Daily   sodium chloride flush  10-40 mL Intracatheter Q12H   tamsulosin  0.4 mg Oral Daily   vancomycin  125 mg Oral QID   Continuous Infusions:  Procedures/Studies: CT ABDOMEN PELVIS W CONTRAST  Result Date: 02/15/2022 CLINICAL DATA:  Right lower quadrant abdominal pain, chronic diarrhea worsening recently EXAM: CT ABDOMEN AND PELVIS WITH CONTRAST TECHNIQUE: Multidetector CT imaging of the abdomen and pelvis was performed using the standard protocol following bolus administration of intravenous contrast. RADIATION DOSE REDUCTION: This exam was performed according to the departmental dose-optimization program which includes automated exposure control, adjustment of the  mA and/or kV according to patient size and/or use of iterative reconstruction technique. CONTRAST:  118mL OMNIPAQUE IOHEXOL 300 MG/ML  SOLN COMPARISON:  03/31/2016 FINDINGS: Lower chest: No acute abnormality. Large hiatal hernia with intrathoracic position of the gastric body and fundus. Hepatobiliary: No focal liver abnormality is seen. Status post cholecystectomy. Mild postoperative biliary dilatation. Pancreas: Unremarkable. No pancreatic ductal dilatation or surrounding inflammatory changes. Spleen: Normal in size without significant abnormality. Adrenals/Urinary Tract: Adrenal glands are unremarkable. There is a 0.4 cm calculus in the proximal third of the right ureter with mild associated hydronephrosis and hydroureter (series 2, image 44). Additional small bilateral nonobstructive calculi. Multiple simple, benign bilateral renal cortical cysts. Bladder is unremarkable. Stomach/Bowel: Stomach is within normal limits. Appendix is not clearly visualized and may be surgically absent. No evidence of bowel wall thickening, distention, or inflammatory changes. Sigmoid diverticula. Vascular/Lymphatic: Aortic atherosclerosis. No enlarged abdominal or pelvic lymph nodes. Reproductive: Status post hysterectomy. Other: No abdominal wall hernia or abnormality. No ascites. Musculoskeletal: There is a new, although age indeterminate superior endplate wedge deformity of the L1 vertebral body (series 6, image 70) IMPRESSION: 1. There is a 0.4 cm calculus in the proximal third of the right ureter with mild associated hydronephrosis and hydroureter. Additional small bilateral nonobstructive calculi. 2. Large hiatal hernia with intrathoracic position of the gastric body and fundus. 3. New, although age indeterminate superior endplate wedge deformity of the L1 vertebral body. Correlate for acutely referable pain and point tenderness. 4. Sigmoid diverticulosis without evidence of acute diverticulitis. Aortic Atherosclerosis  (ICD10-I70.0). Electronically Signed   By: Delanna Ahmadi M.D.   On: 02/15/2022 11:04  DG CHEST PORT 1 VIEW  Result Date: 02/22/2022 CLINICAL DATA:  Fever and weakness. EXAM: PORTABLE CHEST 1 VIEW COMPARISON:  Chest x-ray 04/02/2016 and CT scan abdomen/pelvis, same date. FINDINGS: The heart is mildly enlarged but stable. There is tortuosity and calcification of the thoracic aorta. Stable right paratracheal density. Moderate to large hiatal hernia. Small bilateral pleural effusions and left lower infiltrate better seen on the lung bases of the CT scan. IMPRESSION: 1. Small bilateral pleural effusions and left lower lobe infiltrate. 2. Stable hiatal hernia. Electronically Signed   By: Marijo Sanes M.D.   On: 02/22/2022 17:56   CT Renal Stone Study  Result Date: 02/22/2022 CLINICAL DATA:  Flank pain EXAM: CT ABDOMEN AND PELVIS WITHOUT CONTRAST TECHNIQUE: Multidetector CT imaging of the abdomen and pelvis was performed following the standard protocol without IV contrast. RADIATION DOSE REDUCTION: This exam was performed according to the departmental dose-optimization program which includes automated exposure control, adjustment of the mA and/or kV according to patient size and/or use of iterative reconstruction technique. COMPARISON:  CT abdomen pelvis contrast 02/15/2022 FINDINGS: Lower chest: Trace bilateral pleural effusions. Additional linear and strandy lung base opacities likely sequelae of atelectasis. Hepatobiliary: No focal liver abnormality is seen. Status post cholecystectomy. No biliary dilatation. Pancreas: Unremarkable. No pancreatic ductal dilatation or surrounding inflammatory changes. Spleen: Normal in size without focal abnormality. Adrenals/Urinary Tract: Adrenal glands are normal. Bilateral simple renal cysts are present with the largest located in the lower pole the left kidney measuring 7.5 cm. 2 mm nonobstructing calculus seen in the lower pole of the right kidney. 6 mm nonobstructing  calculus present in the upper pole of the left kidney. No hydronephrosis or hydroureter. There is increased density of the bladder lumen. Stomach/Bowel: Large hiatal hernia. Moderate sigmoid colon diverticulosis without evidence of acute diverticulitis. No dilated loops of bowel to indicate ileus or obstruction. Appendix is not definitively identified. Vascular/Lymphatic: No enlarged lymph nodes. Atherosclerotic calcifications seen throughout the abdominal aorta and visualized branches. Reproductive: Status post hysterectomy. No adnexal masses. Other: No abdominal wall hernia or abnormality. No abdominopelvic ascites. Musculoskeletal: Diffuse osteopenia. Mild compression deformity of the L1 vertebral body is unchanged from prior exam. IMPRESSION: 1. No acute abnormality of the abdomen or pelvis. 2. Nonobstructing bilateral renal calculi. No evidence of obstructive uropathy. 3. Sigmoid colon diverticulosis without evidence of acute diverticulitis. 4. Large hiatal hernia. 5. Hyperdense content of the bladder lumen may be due to contrast excreted from recent CT, however blood products can also have similar appearance. Please correlate with patient history for hematuria. Electronically Signed   By: Miachel Roux M.D.   On: 02/22/2022 14:45    Orson Eva, DO  [facility]  If 7PM-7AM, please contact night-coverage [email or web address] Password [email or web address] 02/25/2022, 5:35 PM   LOS: 3 days

## 2022-02-25 NOTE — Plan of Care (Signed)

## 2022-02-25 NOTE — TOC Progression Note (Signed)
Transition of Care [facility]) - Progression Note    Patient Details  Name: Jodi Webb MRN: [id number] Date of Birth: [birth year]  [facility] CM/SW Contact  Salome Arnt, [place] Phone Number: 02/25/2022, 1:41 PM  Clinical Narrative:  LCSW followed up with pt's daughter with bed offer at [facility]. She is interested in 2 facilities in [place], [place] and [place]. Daughter is aware if facilities cannot accept pt, she will need to go to [facility]. LCSW spoke with [facility] and [place] and was instructed to call back tomorrow to speak with admissions.     Expected Discharge Plan: [place] Barriers to Discharge: Continued Medical Work up  Expected Discharge Plan and Services Expected Discharge Plan: [place] In-house Referral: Clinical Social Work Discharge Planning Services: CM Consult Post Acute Care Choice: [place] Living arrangements for the past 2 months: Single Family Home                                       Social Determinants of Health (SDOH) Interventions    Readmission Risk Interventions Readmission Risk Prevention Plan 02/23/2022  Medication Screening Complete  Transportation Screening Complete  Some recent data might be hidden

## 2022-02-26 ENCOUNTER — Telehealth: Payer: Self-pay | Admitting: Gastroenterology

## 2022-02-26 DIAGNOSIS — A419 Sepsis, unspecified organism: Secondary | ICD-10-CM | POA: Diagnosis not present

## 2022-02-26 DIAGNOSIS — A0472 Enterocolitis due to Clostridium difficile, not specified as recurrent: Secondary | ICD-10-CM | POA: Diagnosis not present

## 2022-02-26 DIAGNOSIS — J9611 Chronic respiratory failure with hypoxia: Secondary | ICD-10-CM | POA: Diagnosis not present

## 2022-02-26 DIAGNOSIS — R6520 Severe sepsis without septic shock: Secondary | ICD-10-CM | POA: Diagnosis not present

## 2022-02-26 LAB — GLUCOSE, CAPILLARY: Glucose-Capillary: 105 mg/dL — ABNORMAL HIGH (ref 70–99)

## 2022-02-26 LAB — BASIC METABOLIC PANEL
Anion gap: 8 (ref 5–15)
BUN: 14 mg/dL (ref 8–23)
CO2: 34 mmol/L — ABNORMAL HIGH (ref 22–32)
Calcium: 8.9 mg/dL (ref 8.9–10.3)
Chloride: 98 mmol/L (ref 98–111)
Creatinine, Ser: 0.56 mg/dL (ref 0.44–1.00)
GFR, Estimated: >60 mL/min (ref >60)
Glucose, Bld: 117 mg/dL — ABNORMAL HIGH (ref 70–99)
Potassium: 3.3 mmol/L — ABNORMAL LOW (ref 3.5–5.1)
Sodium: 140 mmol/L (ref 135–145)

## 2022-02-26 LAB — C-REACTIVE PROTEIN: CRP: 1.7 mg/dL — ABNORMAL HIGH (ref <1.0)

## 2022-02-26 LAB — CBC
HCT: 33.1 % — ABNORMAL LOW (ref 36.0–46.0)
Hemoglobin: 10.1 g/dL — ABNORMAL LOW (ref 12.0–15.0)
MCH: 27.2 pg (ref 26.0–34.0)
MCHC: 30.5 g/dL (ref 30.0–36.0)
MCV: 89 fL (ref 80.0–100.0)
Platelets: 230 10*3/uL (ref 150–400)
RBC: 3.72 MIL/uL — ABNORMAL LOW (ref 3.87–5.11)
RDW: 15.4 % (ref 11.5–15.5)
WBC: 6.2 10*3/uL (ref 4.0–10.5)
nRBC: 0 % (ref 0.0–0.2)

## 2022-02-26 LAB — FERRITIN: Ferritin: 320 ng/mL — ABNORMAL HIGH (ref 11–307)

## 2022-02-26 LAB — MAGNESIUM: Magnesium: 1.5 mg/dL — ABNORMAL LOW (ref 1.7–2.4)

## 2022-02-26 MED ORDER — MAGNESIUM SULFATE 2 GM/50ML IV SOLN
2.0000 g | Freq: Once | INTRAVENOUS | Status: AC
  Administered 2022-02-26: 2 g via INTRAVENOUS
  Filled 2022-02-26: qty 50

## 2022-02-26 MED ORDER — CEFDINIR 300 MG PO CAPS: 300.0000 mg | ORAL_CAPSULE | Freq: Two times a day (BID) | ORAL | 0 refills | Status: DC

## 2022-02-26 MED ORDER — POTASSIUM CHLORIDE CRYS ER 20 MEQ PO TBCR
20.0000 meq | EXTENDED_RELEASE_TABLET | Freq: Once | ORAL | Status: AC
  Administered 2022-02-26: 20 meq via ORAL
  Filled 2022-02-26: qty 1

## 2022-02-26 MED ORDER — FUROSEMIDE 40 MG PO TABS: 40.0000 mg | ORAL_TABLET | Freq: Every day | ORAL | Status: DC

## 2022-02-26 MED ORDER — VANCOMYCIN HCL 125 MG PO CAPS: 125.0000 mg | ORAL_CAPSULE | Freq: Four times a day (QID) | ORAL | 0 refills | Status: DC

## 2022-02-26 MED ORDER — FUROSEMIDE 40 MG PO TABS
40.0000 mg | ORAL_TABLET | Freq: Once | ORAL | Status: AC
  Administered 2022-02-26: 40 mg via ORAL
  Filled 2022-02-26: qty 1

## 2022-02-26 MED ORDER — CHOLESTYRAMINE LIGHT 4 G PO PACK: 4.0000 g | PACK | Freq: Every day | ORAL | Status: AC

## 2022-02-26 MED ORDER — OXYCODONE HCL 5 MG PO TABS: 5.0000 mg | ORAL_TABLET | Freq: Four times a day (QID) | ORAL | 0 refills | Status: AC | PRN

## 2022-02-26 MED ORDER — FERROUS SULFATE 325 (65 FE) MG PO TABS: 325.0000 mg | ORAL_TABLET | Freq: Every day | ORAL | 3 refills | Status: AC

## 2022-02-26 NOTE — Progress Notes (Signed)
Chart reviewed today. Patient will be going to SNF. Discussed with Dr. Carles Collet. Patient improving.   We will arrange outpatient follow-up with Dr. Laural Golden. GI signing off.

## 2022-02-26 NOTE — Telephone Encounter (Signed)
Ov sch'd 03/27/22 with Vikki Ports, apt letter mailed to patient

## 2022-02-26 NOTE — TOC Transition Note (Signed)
Transition of Care [facility]) - CM/SW Discharge Note   Patient Details  Name: Jodi Webb MRN: [id number] Date of Birth: 12/23/1932  Transition of [facility]) CM/SW Contact:  Salome Arnt, LCSW Phone Number: 02/26/2022, 12:28 PM   Clinical Narrative: Pt's daughter accepts bed at [facility] in [place]. D/C today. RN given number to call report. D/C summary faxed. Pt will transport via [facility].        Final next level of care: Skilled Nursing Facility Barriers to Discharge: Barriers Resolved   Patient Goals and CMS Choice Patient states their goals for this hospitalization and ongoing recovery are:: Go to SNF CMS Medicare.gov Compare Post Acute Care list provided to:: Patient Choice offered to / list presented to : Patient, Adult Children  Discharge Placement              Patient chooses bed at: [facility] Patient to be transferred to facility by: [facility] Name of family member notified: Lenna Sciara, daughter Patient and family notified of of transfer: 02/26/22  Discharge Plan and Services In-house Referral: Clinical Social Work Discharge Planning Services: CM Consult Post Acute Care Choice: [place]                               Social Determinants of Health (SDOH) Interventions     Readmission Risk Interventions Readmission Risk Prevention Plan 02/23/2022  Medication Screening Complete  Transportation Screening Complete  Some recent data might be hidden

## 2022-02-26 NOTE — Progress Notes (Signed)
Pt discharged via EMS stretcher to [facility].

## 2022-02-26 NOTE — Discharge Summary (Signed)
Physician Discharge Summary   Patient: Jodi Webb MRN: [id number] DOB: 1932/10/27  Admit date:     02/22/2022  Discharge date: 02/26/22  Discharge Physician: Shanon Brow Tysin Salada   PCP: [name], DO   Recommendations at discharge:   Please follow up with primary care provider within 1-2 weeks  Please repeat BMP and CBC in one week       Hospital Course: 88 year old female with a history of diabetes mellitus type 2, hypertension, chronic respiratory failure on 2.5 L, hyperlipidemia, COPD, and chronic diarrhea presenting with 2-week history of generalized weakness, fevers, suprapubic pain and right flank pain.  The patient visited the emergency department at [facility] on 02/15/2022 with the aforementioned symptoms.  She was diagnosed with COVID at that time.  A CT of the abdomen and pelvis on 02/15/2022 did show a 0.4 cm calculus in the proximal third of the right ureter with some hydronephrosis.  The patient was subsequently discharged home from the ED with Paxlovid, Flomax, Lomotil, and oxycodone.  Her symptoms did not improve.  She subsequently went to the emergency department in [place] on 02/20/2022.  She was sent home with a prescription for Cipro for which she took 4 doses.  She was given a 5-day prescription.  Unfortunately, she continued to be weaker with persistent fevers and nausea.  As result, she presented for further evaluation and treatment on 02/22/2022.  She denies any headache, visual disturbance, neck pain, chest pain, worsening shortness of breath, coughing, hemoptysis.  She does have dysuria without any hematuria.  There is no hematochezia or melena.  The patient was started on ceftriaxone.  CT renal stone protocol showed trace bilateral pleural effusions.  There was a 2 mm nonobstructive calculus on the right kidney, 6 mm nonobstructive calculus in the left kidney.  There was increased density in the urinary bladder.  WBC 14.8, hemoglobin 8.6, platelets 205,000.  Sodium 135,  potassium 3.7, bicarbonate 28, serum creatinine 1.04.  The patient was started on ceftriaxone and IV fluids.  She developed fluid overload and was given torsemide po x 1 and lasix IV 40 mg IV x 1 with clinical improvement.  She continued to improve clinically and respiratory status remained stable.  Her po intake improved and she tolerated her diet.  Assessment and Plan: * Severe sepsis ([place])- (present on admission) Present on admission Presented with leukocytosis, AKI, and fever due to urinary source UA >50WBC Continue ceftriaxone pending urine and blood culture data 02/22/2022 CT renal stone protocol as above Lactic acid 1.1 Sepsis physiology resolved  Acute pyelonephritis Patient has passed a right ureteral stone that was present on CT 02/15/2022 Continued ceftriaxone pending urine culture data Urine culture grew E coli Judicious opioids for pain D/c to SNF with cefdinir x 6 more days  AKI (acute kidney injury) ([place])- (present on admission) Baseline creatinine 0.5-0.6 Presented with serum creatinine 1.04 Secondary to volume depletion Initially fluid resuscitated>>developed fluid overload Given torsemide and lasix IV x 1 Serum creatinine 0.56 at time of d/c  COVID-19 virus infection- (present on admission) Initially COVID-positive on 02/15/2022 Has finished 5 days Paxlovid Stable presently on her baseline 2.5 L nasal cannula  Chronic respiratory failure with hypoxia ([place]) Patient is chronically on 2.5 L nasal cannula -Stable presently -received torsemide x 1 and -lasix 40 mg IV x 1  C. difficile colitis Also Suspect IBS-D Check C. Difficile-->antigen and PCR positive>>treat in light of abd pain and worsen diarrhea>>improved on vancomycin po Stool pathogen panel--neg Daughter requesting GI eval>>discussed with Dr.  Rehman Plan 7 more days po vancomycin after d/c Continue cholestyramine  Iron deficiency anemia Iron saturation 4, ferritin 38 Transfused nulecit 2/25 Started  ferrous sulfate  Intracranial hemorrhage ([place]) Patient follows with neurosurgery at [facility] She has frequent falls Recent CT on 02/05/2022 at OSH showed increased right SDH Patient has opted for surveillance CTs and observation Neck CT scheduled for 03/12/2022  Controlled type 2 diabetes mellitus without complication, without long-term current use of insulin ([place]) Check A1c--5/7 Allow for liberal glycemic control at this point given poor oral intake and FTT Not on any agents at home  Mixed hyperlipidemia Continue statin  Essential hypertension- (present on admission) Holding diltiazem, losartan secondary to soft blood pressure initially Restarted diltiazem Do not plan to restart losartan          Consultants: none Procedures performed: none  Disposition: Skilled nursing facility Diet recommendation:  Regular diet  DISCHARGE MEDICATION: Allergies as of 02/26/2022       Reactions   Penicillins Shortness Of Breath, Rash   Tolerates cephalosporins        Medication List     STOP taking these medications    bisacodyl 10 MG suppository Commonly known as: DULCOLAX   ciprofloxacin 250 MG tablet Commonly known as: CIPRO   dicyclomine 10 MG capsule Commonly known as: BENTYL   diphenoxylate-atropine 2.5-0.025 MG tablet Commonly known as: LOMOTIL   loperamide 2 MG capsule Commonly known as: IMODIUM   losartan 25 MG tablet Commonly known as: COZAAR   pantoprazole 40 MG tablet Commonly known as: PROTONIX   tamsulosin 0.4 MG Caps capsule Commonly known as: FLOMAX       TAKE these medications    acetaminophen 500 MG tablet Commonly known as: TYLENOL Take 1,000 mg by mouth as needed.   ALPRAZolam 0.5 MG tablet Commonly known as: XANAX Take 0.5 mg by mouth 3 (three) times daily.   atorvastatin 10 MG tablet Commonly known as: LIPITOR Take 10 mg by mouth every morning.   cefdinir 300 MG capsule Commonly known as: OMNICEF Take 1 capsule (300 mg  total) by mouth every 12 (twelve) hours. X 6 days   cholestyramine light 4 g packet Commonly known as: PREVALITE Take 1 packet (4 g total) by mouth daily. Start taking on: February 27, 2022   diltiazem 240 MG 24 hr capsule Commonly known as: DILACOR XR Take 240 mg by mouth daily.   escitalopram 10 MG tablet Commonly known as: LEXAPRO Take 10 mg by mouth daily.   ferrous sulfate 325 (65 FE) MG tablet Take 1 tablet (325 mg total) by mouth daily with breakfast. Start taking on: February 27, 2022   furosemide 20 MG tablet Commonly known as: LASIX Take 20 mg by mouth daily. On Mondays  Wednesday and fridays   hydrOXYzine 10 MG tablet Commonly known as: ATARAX Take 10 mg by mouth 3 (three) times daily as needed for itching.   ondansetron 4 MG tablet Commonly known as: ZOFRAN Take 4 mg by mouth 2 (two) times daily.   oxyCODONE 5 MG immediate release tablet Commonly known as: Roxicodone Take 1 tablet (5 mg total) by mouth every 6 (six) hours as needed for severe pain. What changed: when to take this   vancomycin 125 MG capsule Commonly known as: VANCOCIN Take 1 capsule (125 mg total) by mouth 4 (four) times daily. X 7 days         Discharge Exam: Filed Weights   02/22/22 1813  Weight: 72.1 kg   HEENT:  [place]/AT, No thrush, no icterus CV:  RRR, no rub, no S3, no S4 Lung:  bibasilar crackles L>R.  No wheeze Abd:  soft/+BS, NT Ext:  Nonpitting edema, no lymphangitis, no synovitis, no rash   Condition at discharge: stable  The results of significant diagnostics from this hospitalization (including imaging, microbiology, ancillary and laboratory) are listed below for reference.   Imaging Studies: CT ABDOMEN PELVIS W CONTRAST  Result Date: 02/15/2022 CLINICAL DATA:  Right lower quadrant abdominal pain, chronic diarrhea worsening recently EXAM: CT ABDOMEN AND PELVIS WITH CONTRAST TECHNIQUE: Multidetector CT imaging of the abdomen and pelvis was performed using the  standard protocol following bolus administration of intravenous contrast. RADIATION DOSE REDUCTION: This exam was performed according to the departmental dose-optimization program which includes automated exposure control, adjustment of the mA and/or kV according to patient size and/or use of iterative reconstruction technique. CONTRAST:  140mL OMNIPAQUE IOHEXOL 300 MG/ML  SOLN COMPARISON:  03/31/2016 FINDINGS: Lower chest: No acute abnormality. Large hiatal hernia with intrathoracic position of the gastric body and fundus. Hepatobiliary: No focal liver abnormality is seen. Status post cholecystectomy. Mild postoperative biliary dilatation. Pancreas: Unremarkable. No pancreatic ductal dilatation or surrounding inflammatory changes. Spleen: Normal in size without significant abnormality. Adrenals/Urinary Tract: Adrenal glands are unremarkable. There is a 0.4 cm calculus in the proximal third of the right ureter with mild associated hydronephrosis and hydroureter (series 2, image 44). Additional small bilateral nonobstructive calculi. Multiple simple, benign bilateral renal cortical cysts. Bladder is unremarkable. Stomach/Bowel: Stomach is within normal limits. Appendix is not clearly visualized and may be surgically absent. No evidence of bowel wall thickening, distention, or inflammatory changes. Sigmoid diverticula. Vascular/Lymphatic: Aortic atherosclerosis. No enlarged abdominal or pelvic lymph nodes. Reproductive: Status post hysterectomy. Other: No abdominal wall hernia or abnormality. No ascites. Musculoskeletal: There is a new, although age indeterminate superior endplate wedge deformity of the L1 vertebral body (series 6, image 70) IMPRESSION: 1. There is a 0.4 cm calculus in the proximal third of the right ureter with mild associated hydronephrosis and hydroureter. Additional small bilateral nonobstructive calculi. 2. Large hiatal hernia with intrathoracic position of the gastric body and fundus. 3. New,  although age indeterminate superior endplate wedge deformity of the L1 vertebral body. Correlate for acutely referable pain and point tenderness. 4. Sigmoid diverticulosis without evidence of acute diverticulitis. Aortic Atherosclerosis (ICD10-I70.0). Electronically Signed   By: Delanna Ahmadi M.D.   On: 02/15/2022 11:04   DG CHEST PORT 1 VIEW  Result Date: 02/22/2022 CLINICAL DATA:  Fever and weakness. EXAM: PORTABLE CHEST 1 VIEW COMPARISON:  Chest x-ray 04/02/2016 and CT scan abdomen/pelvis, same date. FINDINGS: The heart is mildly enlarged but stable. There is tortuosity and calcification of the thoracic aorta. Stable right paratracheal density. Moderate to large hiatal hernia. Small bilateral pleural effusions and left lower infiltrate better seen on the lung bases of the CT scan. IMPRESSION: 1. Small bilateral pleural effusions and left lower lobe infiltrate. 2. Stable hiatal hernia. Electronically Signed   By: Marijo Sanes M.D.   On: 02/22/2022 17:56   CT Renal Stone Study  Result Date: 02/22/2022 CLINICAL DATA:  Flank pain EXAM: CT ABDOMEN AND PELVIS WITHOUT CONTRAST TECHNIQUE: Multidetector CT imaging of the abdomen and pelvis was performed following the standard protocol without IV contrast. RADIATION DOSE REDUCTION: This exam was performed according to the departmental dose-optimization program which includes automated exposure control, adjustment of the mA and/or kV according to patient size and/or use of iterative reconstruction technique. COMPARISON:  CT abdomen  pelvis contrast 02/15/2022 FINDINGS: Lower chest: Trace bilateral pleural effusions. Additional linear and strandy lung base opacities likely sequelae of atelectasis. Hepatobiliary: No focal liver abnormality is seen. Status post cholecystectomy. No biliary dilatation. Pancreas: Unremarkable. No pancreatic ductal dilatation or surrounding inflammatory changes. Spleen: Normal in size without focal abnormality. Adrenals/Urinary Tract:  Adrenal glands are normal. Bilateral simple renal cysts are present with the largest located in the lower pole the left kidney measuring 7.5 cm. 2 mm nonobstructing calculus seen in the lower pole of the right kidney. 6 mm nonobstructing calculus present in the upper pole of the left kidney. No hydronephrosis or hydroureter. There is increased density of the bladder lumen. Stomach/Bowel: Large hiatal hernia. Moderate sigmoid colon diverticulosis without evidence of acute diverticulitis. No dilated loops of bowel to indicate ileus or obstruction. Appendix is not definitively identified. Vascular/Lymphatic: No enlarged lymph nodes. Atherosclerotic calcifications seen throughout the abdominal aorta and visualized branches. Reproductive: Status post hysterectomy. No adnexal masses. Other: No abdominal wall hernia or abnormality. No abdominopelvic ascites. Musculoskeletal: Diffuse osteopenia. Mild compression deformity of the L1 vertebral body is unchanged from prior exam. IMPRESSION: 1. No acute abnormality of the abdomen or pelvis. 2. Nonobstructing bilateral renal calculi. No evidence of obstructive uropathy. 3. Sigmoid colon diverticulosis without evidence of acute diverticulitis. 4. Large hiatal hernia. 5. Hyperdense content of the bladder lumen may be due to contrast excreted from recent CT, however blood products can also have similar appearance. Please correlate with patient history for hematuria. Electronically Signed   By: Miachel Roux M.D.   On: 02/22/2022 14:45    Microbiology: Results for orders placed or performed during the hospital encounter of 02/22/22  Resp Panel by RT-PCR (Flu A&B, Covid) Nasopharyngeal Swab     Status: Abnormal   Collection Time: 02/22/22  1:32 PM   Specimen: Nasopharyngeal Swab; Nasopharyngeal(NP) swabs in vial transport medium  Result Value Ref Range Status   SARS Coronavirus 2 by RT PCR POSITIVE (A) NEGATIVE Final    Comment: (NOTE) SARS-CoV-2 target nucleic acids are  DETECTED.  The SARS-CoV-2 RNA is generally detectable in upper respiratory specimens during the acute phase of infection. Positive results are indicative of the presence of the identified virus, but do not rule out bacterial infection or co-infection with other pathogens not detected by the test. Clinical correlation with patient history and other diagnostic information is necessary to determine patient infection status. The expected result is Negative.  Fact Sheet for Patients: EntrepreneurPulse.com.au  Fact Sheet for Healthcare Providers: IncredibleEmployment.be  This test is not yet approved or cleared by the [place] FDA and  has been authorized for detection and/or diagnosis of SARS-CoV-2 by FDA under an Emergency Use Authorization (EUA).  This EUA will remain in effect (meaning this test can be used) for the duration of  the COVID-19 declaration under Section 564(b)(1) of the A ct, 21 U.S.C. section 360bbb-3(b)(1), unless the authorization is terminated or revoked sooner.     Influenza A by PCR NEGATIVE NEGATIVE Final   Influenza B by PCR NEGATIVE NEGATIVE Final    Comment: (NOTE) The Xpert Xpress SARS-CoV-2/FLU/RSV plus assay is intended as an aid in the diagnosis of influenza from Nasopharyngeal swab specimens and should not be used as a sole basis for treatment. Nasal washings and aspirates are unacceptable for Xpert Xpress SARS-CoV-2/FLU/RSV testing.  Fact Sheet for Patients: EntrepreneurPulse.com.au  Fact Sheet for Healthcare Providers: IncredibleEmployment.be  This test is not yet approved or cleared by the [place] FDA and has been  authorized for detection and/or diagnosis of SARS-CoV-2 by FDA under an Emergency Use Authorization (EUA). This EUA will remain in effect (meaning this test can be used) for the duration of the COVID-19 declaration under Section 564(b)(1) of the Act, 21  U.S.C. section 360bbb-3(b)(1), unless the authorization is terminated or revoked.  Performed at [facility], [address] [place], [place] [address]   Urine Culture     Status: Abnormal   Collection Time: 02/22/22  1:32 PM   Specimen: Urine, Catheterized  Result Value Ref Range Status   Specimen Description   Final    URINE, CATHETERIZED Performed at [facility], [address] [place], [place] [address]    Special Requests   Final    NONE Performed at [facility], [address], [place], [place] [address]    Culture >=100,000 COLONIES/mL ESCHERICHIA COLI (A)  Final   Report Status 02/25/2022 FINAL  Final   Organism ID, Bacteria ESCHERICHIA COLI (A)  Final      Susceptibility   Escherichia coli - MIC*    AMPICILLIN <=2 SENSITIVE Sensitive     CEFAZOLIN <=4 SENSITIVE Sensitive     CEFEPIME <=0.12 SENSITIVE Sensitive     CEFTRIAXONE <=0.25 SENSITIVE Sensitive     CIPROFLOXACIN >=4 RESISTANT Resistant     GENTAMICIN <=1 SENSITIVE Sensitive     IMIPENEM <=0.25 SENSITIVE Sensitive     NITROFURANTOIN 64 INTERMEDIATE Intermediate     TRIMETH/SULFA <=20 SENSITIVE Sensitive     AMPICILLIN/SULBACTAM <=2 SENSITIVE Sensitive     PIP/TAZO <=4 SENSITIVE Sensitive     * >=100,000 COLONIES/mL ESCHERICHIA COLI  Blood Culture (routine x 2)     Status: None (Preliminary result)   Collection Time: 02/22/22  1:49 PM   Specimen: BLOOD LEFT ARM  Result Value Ref Range Status   Specimen Description BLOOD LEFT ARM  Final   Special Requests   Final    BOTTLES DRAWN AEROBIC AND ANAEROBIC Blood Culture adequate volume   Culture   Final    NO GROWTH 4 DAYS Performed at [facility], [address] [place], [place] [address]    Report Status PENDING  Incomplete  Blood Culture (routine x 2)     Status: None (Preliminary result)   Collection Time: 02/22/22  1:49 PM   Specimen: BLOOD RIGHT ARM  Result Value Ref Range Status   Specimen Description BLOOD RIGHT ARM  Final   Special Requests    Final    BOTTLES DRAWN AEROBIC AND ANAEROBIC Blood Culture adequate volume   Culture   Final    NO GROWTH 4 DAYS Performed at [facility], [address], [place], [place] [address]    Report Status PENDING  Incomplete  Gastrointestinal Panel by PCR , Stool     Status: None   Collection Time: 02/23/22  7:35 AM   Specimen: STOOL  Result Value Ref Range Status   Campylobacter species NOT DETECTED NOT DETECTED Final   Plesimonas shigelloides NOT DETECTED NOT DETECTED Final   Salmonella species NOT DETECTED NOT DETECTED Final   Yersinia enterocolitica NOT DETECTED NOT DETECTED Final   Vibrio species NOT DETECTED NOT DETECTED Final   Vibrio cholerae NOT DETECTED NOT DETECTED Final   Enteroaggregative E coli (EAEC) NOT DETECTED NOT DETECTED Final   Enteropathogenic E coli (EPEC) NOT DETECTED NOT DETECTED Final   Enterotoxigenic E coli (ETEC) NOT DETECTED NOT DETECTED Final   Shiga like toxin producing E coli (STEC) NOT DETECTED NOT DETECTED Final   Shigella/Enteroinvasive  E coli (EIEC) NOT DETECTED NOT DETECTED Final   Cryptosporidium NOT DETECTED NOT DETECTED Final   Cyclospora cayetanensis NOT DETECTED NOT DETECTED Final   Entamoeba histolytica NOT DETECTED NOT DETECTED Final   Giardia lamblia NOT DETECTED NOT DETECTED Final   Adenovirus F40/41 NOT DETECTED NOT DETECTED Final   Astrovirus NOT DETECTED NOT DETECTED Final   Norovirus GI/GII NOT DETECTED NOT DETECTED Final   Rotavirus A NOT DETECTED NOT DETECTED Final   Sapovirus (I, II, IV, and V) NOT DETECTED NOT DETECTED Final    Comment: Performed at [facility], [address] [place], [place] [address]  C Difficile Quick Screen w PCR reflex     Status: Abnormal   Collection Time: 02/23/22  7:36 AM   Specimen: STOOL  Result Value Ref Range Status   C Diff antigen POSITIVE (A) NEGATIVE Final   C Diff toxin NEGATIVE NEGATIVE Final   C Diff interpretation Results are indeterminate. See PCR results.  Final    Comment:  Performed at [facility], [address], [place], [place] [address]  C. Diff by PCR, Reflexed     Status: Abnormal   Collection Time: 02/23/22  7:36 AM  Result Value Ref Range Status   Toxigenic C. Difficile by PCR POSITIVE (A) NEGATIVE Final    Comment: Positive for toxigenic C. difficile with little to no toxin production. Only treat if clinical presentation suggests symptomatic illness. Performed at [facility], [place] [address], [place], [place] [address]     Labs: CBC: Recent Labs  Lab 02/22/22 1348 02/23/22 0551 02/23/22 2322 02/24/22 0515 02/25/22 0708 02/26/22 0544  WBC 14.8* 9.4  --  7.4 6.6 6.2  NEUTROABS 13.1*  --   --   --   --   --   HGB 8.6* 7.0* 9.1* 9.4* 10.8* 10.1*  HCT 29.9* 25.5* 31.8* 31.9* 35.9* 33.1*  MCV 89.5 89.8  --  91.4 90.2 89.0  PLT 205 165  --  175 192 142   Basic Metabolic Panel: Recent Labs  Lab 02/22/22 1348 02/23/22 0551 02/24/22 0515 02/25/22 0559 02/26/22 0544  NA 135 136 140 141 140  K 3.7 3.8 4.2 3.7 3.3*  CL 98 104 109 101 98  CO2 28 26 26 29  34*  GLUCOSE 124* 110* 119* 108* 117*  BUN 34* 28* 21 13 14   CREATININE 1.04* 0.71 0.59 0.54 0.56  CALCIUM 8.7* 8.2* 8.7* 8.7* 8.9  MG  --   --  1.8  --  1.5*   Liver Function Tests: Recent Labs  Lab 02/22/22 1348 02/25/22 0559  AST 19 21  ALT 15 14  ALKPHOS 49 47  BILITOT 0.5 0.6  PROT 6.5 6.0*  ALBUMIN 2.8* 2.5*   CBG: Recent Labs  Lab 02/24/22 1700 02/24/22 2119 02/25/22 0718 02/25/22 1116 02/26/22 0600  GLUCAP 151* 122* 105* 129* 105*    Discharge time spent: greater than 30 minutes.  Signed: Orson Eva, MD [facility] 02/26/2022

## 2022-02-26 NOTE — Telephone Encounter (Signed)
Jodi Webb, patient needs hospital follow-up. Recently inpatient. Thanks!

## 2022-02-27 LAB — CULTURE, BLOOD (ROUTINE X 2)
Culture: NO GROWTH
Culture: NO GROWTH
Special Requests: ADEQUATE
Special Requests: ADEQUATE

## 2022-03-26 ENCOUNTER — Encounter (INDEPENDENT_AMBULATORY_CARE_PROVIDER_SITE_OTHER): Payer: Self-pay | Admitting: Gastroenterology

## 2022-03-26 ENCOUNTER — Other Ambulatory Visit: Payer: Self-pay

## 2022-03-26 ENCOUNTER — Ambulatory Visit (INDEPENDENT_AMBULATORY_CARE_PROVIDER_SITE_OTHER): Payer: Medicare Other | Admitting: Gastroenterology

## 2022-03-26 VITALS — Ht 61.0 in | Wt 145.0 lb

## 2022-03-26 DIAGNOSIS — D508 Other iron deficiency anemias: Secondary | ICD-10-CM | POA: Diagnosis not present

## 2022-03-26 DIAGNOSIS — K219 Gastro-esophageal reflux disease without esophagitis: Secondary | ICD-10-CM | POA: Diagnosis not present

## 2022-03-26 DIAGNOSIS — A0472 Enterocolitis due to Clostridium difficile, not specified as recurrent: Secondary | ICD-10-CM

## 2022-03-26 NOTE — Patient Instructions (Signed)
I Will attempt to obtain labs from rehab facility, if no iron studies checked, will need to recheck these ?Continue ferrous sulfate daily at this time  ?Continue cholestyramine 4g daily ?Please consider endoscopic evaluation via colonoscopy and EGD as you had positive blood in your stool and iron deficiency anemia requiring blood transfusion during your hospitalization, these things are concerning for a GI source of blood loss. ?If you have worsening fatigue, shortness of breath, dizziness or rectal bleeding, please let me know ? ?Follow up 3 months ?

## 2022-03-26 NOTE — Progress Notes (Signed)
? ?Primary Care Physician:  [name], DO  ?Primary GI: [facility] ? ?Patient Location: Home ?  ?Provider Location: [place] office ?  ?Reason for Visit: hospital follow up ?  ?Persons present on the virtual encounter, with roles: Jodi York L. Alver Sorrow, MSN, APRN, AGNP-C, Jodi Webb, patient ?  ?Total time (minutes) spent on medical discussion: 10 minutes ? ?Virtual Visit via Telephone ?visit is conducted virtually and was requested by patient.  ? ?I connected with Jodi Webb on 03/26/22 at  3:30 PM EDT by telephone and verified that I am speaking with the correct person using two identifiers. ?  ?I discussed the limitations, risks, security and privacy concerns of performing an evaluation and management service by telephone and the availability of in person appointments. I also discussed with the patient that there may be a patient responsible charge related to this service. The patient expressed understanding and agreed to proceed. ? ?Chief Complaint  ?Patient presents with  ? Follow-up  ?  Patient here today for a hospital follow up from 02/22/2022. Patient had c-diff after being in patient. No current GI issues.  ? ?History of Present Illness: ?Jodi Webb is an 88 year old female with a history of diabetes mellitus type 2, hypertension, COPD with chronic respiratory failure on 2.5 L, hyperlipidemia, COPD, chronic diarrhea though to be secondary to IBS, GERD. ? ?Patient presenting today for hospital follow up after admission February 22, 2022 for acute on chronic diarrhea with positive C diff testing. Treated with oral vancomycin x 10 days and continued on cholestyramine 4g daily. Stool also heme positive with presence of IDA with hgb of 7 Iron 8, TIBC 226, saturation 4%, ferritin initially 38 , no overt GI bleeding during admission, likely multifactorial. Received 2 units PRBCs, considerations for outpatient work up of anemia on outpatient basis. Celiac panel negative, TSH  0.329.  ? ?Hemoglobin 10.1 with MCV 89, ferritin 320 on 02/26/22. Started on ferrous sulfate at discharge and Rx for 7 more days of vancomycin '125mg'$  QID  to complete 10 days total of treatment.  ? ?Today, she states she is doing better since d/c from the hospital. She reports she has had no further diarrhea. She completed the vancomycin course. Is having 1-2 BMs per day. Denies BRBPR, does endorse darker stools since being on iron pills. She does endorse some SOB, fatigue and dizziness at times, though this is not new for her. She does note some issues with acid reflux maybe twice a week. She takes tums PRN, usually reflux symptoms occur with certain foods. Denies dysphagia, early satiety or odynophagia. Reports she has lost some weight since being discharged, approx 5 pounds. Reports appetite is good. She is at a rehab facility in [place], supposed to be going home on Friday. Has been having routine labs done there that are reportedly normal, though I am unable to review any of these at this time.  ? ?Last colonoscopy: Oct 2009 scattered diverticula at sigmoid and descending colon.  ? ?Past Medical History:  ?Diagnosis Date  ? Chronic diarrhea   ? Colitis, infectious 03/31/2016  ? Diabetes mellitus without complication ([place])   ? Diverticulosis   ? GERD (gastroesophageal reflux disease)   ? Hyperlipemia   ? Hypertension   ? IBS (irritable bowel syndrome)   ? Nausea without vomiting   ? Renal disorder   ? kidney stones  ? ? ? ?Past Surgical History:  ?Procedure Laterality Date  ? ABDOMINAL HYSTERECTOMY    ? BACK SURGERY    ?  CHOLECYSTECTOMY    ? COLONOSCOPY    ? 2/1/2 year ago [name] , Dr.O'Neal  ? kidney stents    ? UPPER GASTROINTESTINAL ENDOSCOPY    ? 2/1/2 year ago , [place], Dr.O'Neal.  ? ? ? ?Current Meds  ?Medication Sig  ? acetaminophen (TYLENOL) 500 MG tablet Take 1,000 mg by mouth as needed.  ? ALPRAZolam (XANAX) 0.5 MG tablet Take 0.5 mg by mouth 3 (three) times daily.  ? atorvastatin  (LIPITOR) 10 MG tablet Take 10 mg by mouth every morning.  ? cholestyramine light (PREVALITE) 4 g packet Take 1 packet (4 g total) by mouth daily.  ? diltiazem (DILACOR XR) 240 MG 24 hr capsule Take 240 mg by mouth daily.  ? escitalopram (LEXAPRO) 10 MG tablet Take 20 mg by mouth daily.  ? ferrous sulfate 325 (65 FE) MG tablet Take 1 tablet (325 mg total) by mouth daily with breakfast.  ? furosemide (LASIX) 20 MG tablet Take 20 mg by mouth daily. On Mondays  Wednesday and fridays  ? hydrOXYzine (ATARAX) 10 MG tablet Take 10 mg by mouth 3 (three) times daily as needed for itching.  ? ondansetron (ZOFRAN) 4 MG tablet Take 4 mg by mouth 2 (two) times daily.  ? OVER THE COUNTER MEDICATION Bactrim mg unknown BID  ?  ? ?Family History  ?Problem Relation Age of Onset  ? Ovarian cancer Mother   ? Heart disease Mother   ? Cirrhosis Father   ? Hip dysplasia Sister   ? ALS Sister   ? Arthritis Sister   ? Colon cancer Sister   ? Dementia Sister   ? Cirrhosis Daughter   ? ? ?Social History  ? ?Socioeconomic History  ? Marital status: Widowed  ?  Spouse name: Not on file  ? Number of children: Not on file  ? Years of education: Not on file  ? Highest education level: Not on file  ?Occupational History  ? Not on file  ?Tobacco Use  ? Smoking status: Former  ?  Types: Cigarettes  ?  Quit date: 05/01/1976  ?  Years since quitting: 45.9  ? Smokeless tobacco: Never  ?Vaping Use  ? Vaping Use: Never used  ?Substance and Sexual Activity  ? Alcohol use: No  ?  Alcohol/week: 0.0 standard drinks  ? Drug use: No  ? Sexual activity: Not on file  ?Other Topics Concern  ? Not on file  ?Social History Narrative  ? Not on file  ? ?Social Determinants of Health  ? ?Financial Resource Strain: Not on file  ?Food Insecurity: Not on file  ?Transportation Needs: Not on file  ?Physical Activity: Not on file  ?Stress: Not on file  ?Social Connections: Not on file  ? ?Review of Systems: ?Gen: Denies fever, chills, anorexia. Denies fatigue, weakness, weight  loss.  ?CV: Denies chest pain, palpitations, syncope, peripheral edema, and claudication. ?Resp: Denies dyspnea at rest, cough, wheezing, coughing up blood, and pleurisy. ?GI: see HPI ?Derm: Denies rash, itching, dry skin ?Psych: Denies depression, anxiety, memory loss, confusion. No homicidal or suicidal ideation.  ?Heme: Denies bruising, bleeding, and enlarged lymph nodes. ? ?Observations/Objective: ?No distress. Unable to perform physical exam due to telephone encounter. No video available.  ? ?Assessment and Plan: ?Jodi Webb is an 88 year old female who presents virtually for hospital follow up for IDA with positive FOBT and C diff.  ? ?Patient states she is still at a rehab facility currently, supposed to be discharged home on  Friday. Completed 10 day course of vancomycin. Denies BRBPR. Is maintained on ferrous sulfate daily, having darker stools since being on this. Denies any further diarrhea since discharge. Denies abdominal pain, nausea, vomiting. Appetite is improving since discharge from hospital. I discussed indications of proceeding with EGD and Colonoscopy for further evaluation of positive FOBT and IDA, however, patient states she wants to think about this as she does not know if she cares to pursue further evaluation at this time. I will attempt to obtain labs from rehab facility, though if no recent iron studies done, will need to recheck these as well. She will let me know if she has any new or worsening GI symptoms.  ? ?In regards to GERD, reports maybe two episodes of acid reflux per week which she takes tums for PRN with good results. Reflux only occurs with certain trigger foods. She denies any dysphagia, odynophagia, early satiety, bloating.  ? ?-Will attempt to obtain labs from rehab facility, if no iron studies checked, will need to recheck these ?-Continue ferrous sulfate daily ?-Continue cholestyramine 4g daily ?-Patient to consider endoscopic evaluation of positive FOBT and  IDA, will let me know if she wants to pursue these ? ?Follow Up Instructions: ?3 months ?  ?I discussed the assessment and treatment plan with the patient. The patient was provided an opportunity to ask question

## 2022-03-27 ENCOUNTER — Ambulatory Visit (INDEPENDENT_AMBULATORY_CARE_PROVIDER_SITE_OTHER): Payer: Medicare Other | Admitting: Gastroenterology

## 2022-03-31 ENCOUNTER — Emergency Department (HOSPITAL_COMMUNITY)
Admission: EM | Admit: 2022-03-31 | Discharge: 2022-03-31 | Disposition: A | Discharge status: Home / Self Care | Payer: Medicare Other | Attending: Emergency Medicine | Admitting: Emergency Medicine

## 2022-03-31 ENCOUNTER — Encounter (HOSPITAL_COMMUNITY): Payer: Self-pay | Admitting: Emergency Medicine

## 2022-03-31 ENCOUNTER — Emergency Department (HOSPITAL_COMMUNITY): Payer: Medicare Other

## 2022-03-31 ENCOUNTER — Other Ambulatory Visit: Payer: Self-pay

## 2022-03-31 DIAGNOSIS — Z79899 Other long term (current) drug therapy: Secondary | ICD-10-CM | POA: Insufficient documentation

## 2022-03-31 DIAGNOSIS — N3001 Acute cystitis with hematuria: Secondary | ICD-10-CM | POA: Insufficient documentation

## 2022-03-31 DIAGNOSIS — R1030 Lower abdominal pain, unspecified: Secondary | ICD-10-CM | POA: Diagnosis present

## 2022-03-31 DIAGNOSIS — R1084 Generalized abdominal pain: Secondary | ICD-10-CM

## 2022-03-31 LAB — COMPREHENSIVE METABOLIC PANEL
ALT: 11 U/L (ref 0–44)
AST: 11 U/L — ABNORMAL LOW (ref 15–41)
Albumin: 3.7 g/dL (ref 3.5–5.0)
Alkaline Phosphatase: 59 U/L (ref 38–126)
Anion gap: 9 (ref 5–15)
BUN: 16 mg/dL (ref 8–23)
CO2: 25 mmol/L (ref 22–32)
Calcium: 9.4 mg/dL (ref 8.9–10.3)
Chloride: 102 mmol/L (ref 98–111)
Creatinine, Ser: 0.59 mg/dL (ref 0.44–1.00)
GFR, Estimated: >60 mL/min (ref >60)
Glucose, Bld: 114 mg/dL — ABNORMAL HIGH (ref 70–99)
Potassium: 3.8 mmol/L (ref 3.5–5.1)
Sodium: 136 mmol/L (ref 135–145)
Total Bilirubin: 0.5 mg/dL (ref 0.3–1.2)
Total Protein: 7.1 g/dL (ref 6.5–8.1)

## 2022-03-31 LAB — CBC WITH DIFFERENTIAL/PLATELET
Abs Immature Granulocytes: 0.05 10*3/uL (ref 0.00–0.07)
Basophils Absolute: 0 10*3/uL (ref 0.0–0.1)
Basophils Relative: 0 %
Eosinophils Absolute: 0.1 10*3/uL (ref 0.0–0.5)
Eosinophils Relative: 1 %
HCT: 41.9 % (ref 36.0–46.0)
Hemoglobin: 12.6 g/dL (ref 12.0–15.0)
Immature Granulocytes: 1 %
Lymphocytes Relative: 10 %
Lymphs Abs: 0.9 10*3/uL (ref 0.7–4.0)
MCH: 27.2 pg (ref 26.0–34.0)
MCHC: 30.1 g/dL (ref 30.0–36.0)
MCV: 90.3 fL (ref 80.0–100.0)
Monocytes Absolute: 0.7 10*3/uL (ref 0.1–1.0)
Monocytes Relative: 8 %
Neutro Abs: 7.4 10*3/uL (ref 1.7–7.7)
Neutrophils Relative %: 80 %
Platelets: 182 10*3/uL (ref 150–400)
RBC: 4.64 MIL/uL (ref 3.87–5.11)
RDW: 18 % — ABNORMAL HIGH (ref 11.5–15.5)
WBC: 9.2 10*3/uL (ref 4.0–10.5)
nRBC: 0 % (ref 0.0–0.2)

## 2022-03-31 LAB — URINALYSIS, ROUTINE W REFLEX MICROSCOPIC
Bilirubin Urine: NEGATIVE
Glucose, UA: NEGATIVE mg/dL
Ketones, ur: NEGATIVE mg/dL
Nitrite: NEGATIVE
Protein, ur: 30 mg/dL — AB
Specific Gravity, Urine: 1.021 (ref 1.005–1.030)
pH: 5 (ref 5.0–8.0)

## 2022-03-31 MED ORDER — HYDROCODONE-ACETAMINOPHEN 5-325 MG PO TABS
1.0000 | ORAL_TABLET | Freq: Once | ORAL | Status: AC
  Administered 2022-03-31: 1 via ORAL
  Filled 2022-03-31: qty 1

## 2022-03-31 MED ORDER — SODIUM CHLORIDE 0.9 % IV SOLN
1.0000 g | Freq: Once | INTRAVENOUS | Status: AC
  Administered 2022-03-31: 1 g via INTRAVENOUS
  Filled 2022-03-31: qty 10

## 2022-03-31 MED ORDER — HYDROCODONE-ACETAMINOPHEN 5-325 MG PO TABS: 1.0000 | ORAL_TABLET | Freq: Three times a day (TID) | ORAL | 0 refills | Status: AC | PRN

## 2022-03-31 MED ORDER — NITROFURANTOIN MONOHYD MACRO 100 MG PO CAPS: 100.0000 mg | ORAL_CAPSULE | Freq: Two times a day (BID) | ORAL | 0 refills | Status: AC

## 2022-03-31 NOTE — Discharge Instructions (Signed)
It is extremely important that you follow-up with your primary care doctor in the office soon as possible for your visit to the ER.  Your doctor's office should follow-up on the urine culture results.  You were treated with antibiotics for likely urine infection today but the culture results will take a few days. ?

## 2022-03-31 NOTE — ED Provider Notes (Signed)
?[facility] ?Provider Note ? ? ?CSN: [id number] ?Arrival date & time: 03/31/22  0941 ? ?  ? ?History ? ?Chief Complaint  ?Patient presents with  ? Pelvic Pain  ? ? ?Jodi Webb is a 88 y.o. female presented to ED with lower abdominal and flank pain.  Patient reports that she has been having nausea and lower abdominal pain and dysuria, burning with peeing, for about 2 weeks.  She says she was treated with a full week of Bactrim which she finished today for a possible UTI, after discharge from her rehab facility.  She continues to have dysuria and also pain in her bilateral lower back.  Her daughter at the bedside reports the patient has had "kidney infections" in the past.  Patient was also treated for C. difficile recently with a prolonged course of vancomycin which she completed, has had overall improvement in the consistency of her stool and also the frequency of diarrhea. ? ?HPI ? ?  ? ?Home Medications ?Prior to Admission medications   ?Medication Sig Start Date End Date Taking? Authorizing Provider  ?acetaminophen (TYLENOL) 500 MG tablet Take 1,000 mg by mouth every 4 (four) hours as needed for mild pain, fever or headache.   Yes [provider]  ?ALPRAZolam (XANAX) 0.5 MG tablet Take 0.5 mg by mouth 3 (three) times daily.   Yes [provider]  ?amLODipine (NORVASC) 5 MG tablet Take 5 mg by mouth daily.   Yes [provider]  ?atorvastatin (LIPITOR) 10 MG tablet Take 10 mg by mouth every morning.   Yes [provider]  ?cholestyramine light (PREVALITE) 4 g packet Take 1 packet (4 g total) by mouth daily. 02/27/22  Yes Tat, Shanon Brow, MD  ?diltiazem (DILACOR XR) 240 MG 24 hr capsule Take 240 mg by mouth daily.   Yes [provider]  ?escitalopram (LEXAPRO) 20 MG tablet Take 20 mg by mouth daily.   Yes [provider]  ?ferrous sulfate 325 (65 FE) MG tablet Take 1 tablet (325 mg total) by mouth daily with breakfast. 02/27/22  Yes Tat,  Shanon Brow, MD  ?furosemide (LASIX) 20 MG tablet Take 20 mg by mouth every Monday, Wednesday, and Friday. On Mondays  Wednesday and fridays   Yes [provider]  ?HYDROcodone-acetaminophen (NORCO/VICODIN) 5-325 MG tablet Take 1 tablet by mouth every 8 (eight) hours as needed for up to 15 doses. 03/31/22  Yes Kinan Safley, Carola Rhine, MD  ?hydrOXYzine (ATARAX) 10 MG tablet Take 10 mg by mouth 3 (three) times daily as needed for itching.   Yes [provider]  ?nitrofurantoin, macrocrystal-monohydrate, (MACROBID) 100 MG capsule Take 1 capsule (100 mg total) by mouth 2 (two) times daily for 5 days. 03/31/22 04/05/22 Yes Pearl Berlinger, Carola Rhine, MD  ?ondansetron (ZOFRAN) 4 MG tablet Take 4 mg by mouth 2 (two) times daily.   Yes [provider]  ?sulfamethoxazole-trimethoprim (BACTRIM DS) 800-160 MG tablet Take 1 tablet by mouth 2 (two) times daily. Starting 03/24/22 x 7 days.   Yes [provider]  ?oxyCODONE (ROXICODONE) 5 MG immediate release tablet Take 1 tablet (5 mg total) by mouth every 6 (six) hours as needed for severe pain. ?Patient not taking: Reported on 03/26/2022 02/26/22   Orson Eva, MD  ?   ? ?Allergies    ?Penicillins   ? ?Review of Systems   ?Review of Systems ? ?Physical Exam ?Updated Vital Signs ?BP (!) 153/59   Pulse 60   Temp 98.1 ?F (36.7 ?C) (Oral)  Resp 18   Ht '5\' 1"'$  (1.549 m)   Wt 60.8 kg   SpO2 92%   BMI 25.32 kg/m?  ?Physical Exam ?Constitutional:   ?   General: She is not in acute distress. ?HENT:  ?   Head: Normocephalic and atraumatic.  ?Eyes:  ?   Conjunctiva/sclera: Conjunctivae normal.  ?   Pupils: Pupils are equal, round, and reactive to light.  ?Cardiovascular:  ?   Rate and Rhythm: Normal rate and regular rhythm.  ?Pulmonary:  ?   Effort: Pulmonary effort is normal. No respiratory distress.  ?Abdominal:  ?   General: There is no distension.  ?   Tenderness: There is no abdominal tenderness. There is right CVA tenderness and left CVA tenderness. There is no guarding.   ?Skin: ?   General: Skin is warm and dry.  ?Neurological:  ?   General: No focal deficit present.  ?   Mental Status: She is alert. Mental status is at baseline.  ?Psychiatric:     ?   Mood and Affect: Mood normal.     ?   Behavior: Behavior normal.  ? ? ?ED Results / Procedures / Treatments   ?Labs ?(all labs ordered are listed, but only abnormal results are displayed) ?Labs Reviewed  ?COMPREHENSIVE METABOLIC PANEL - Abnormal; Notable for the following components:  ?    Result Value  ? Glucose, Bld 114 (*)   ? AST 11 (*)   ? All other components within normal limits  ?CBC WITH DIFFERENTIAL/PLATELET - Abnormal; Notable for the following components:  ? RDW 18.0 (*)   ? All other components within normal limits  ?URINALYSIS, ROUTINE W REFLEX MICROSCOPIC - Abnormal; Notable for the following components:  ? Hgb urine dipstick MODERATE (*)   ? Protein, ur 30 (*)   ? Leukocytes,Ua TRACE (*)   ? Bacteria, UA RARE (*)   ? All other components within normal limits  ?URINE CULTURE  ? ? ?EKG ?None ? ?Radiology ?DG Chest 2 View ? ?Result Date: 03/31/2022 ?CLINICAL DATA:  Chest pain and nausea/vomiting. EXAM: CHEST - 2 VIEW COMPARISON:  02/22/2022 and prior studies FINDINGS: UPPER limits normal heart size again noted. A large hiatal hernia is again identified. There is no evidence of focal airspace disease, pulmonary edema, suspicious pulmonary nodule/mass, pleural effusion, or pneumothorax. No acute bony abnormalities are identified. An L1 compression fracture is unchanged. IMPRESSION: 1. No evidence of acute cardiopulmonary disease. 2. Large hiatal hernia. Electronically Signed   By: Margarette Canada M.D.   On: 03/31/2022 13:22  ? ?CT Renal Stone Study ? ?Result Date: 03/31/2022 ?CLINICAL DATA:  Flank pain EXAM: CT ABDOMEN AND PELVIS WITHOUT CONTRAST TECHNIQUE: Multidetector CT imaging of the abdomen and pelvis was performed following the standard protocol without IV contrast. RADIATION DOSE REDUCTION: This exam was performed  according to the departmental dose-optimization program which includes automated exposure control, adjustment of the mA and/or kV according to patient size and/or use of iterative reconstruction technique. COMPARISON:  02/22/2022 FINDINGS: Lower chest: There is moderate to large fixed hiatal hernia. Breathing motion limits evaluation of lower lung fields. There is no pleural effusion. There are scattered coronary artery calcifications. Hepatobiliary: No focal abnormality is seen in the liver. Surgical clips are seen in gallbladder fossa. Pancreas: No focal abnormality is seen. Spleen: Unremarkable. Adrenals/Urinary Tract: Adrenals are unremarkable. There is no hydronephrosis. There are multiple smooth marginated low-density lesions in both kidneys largest measuring 7.7 cm in the lower pole of left kidney suggesting  renal cysts. There are few punctate calcific densities in both kidneys. There is 7 mm calcific density in the central portion of left kidney inseparable from renal artery branch. This most likely is vascular calcification. Ureters are not dilated. Urinary bladder is not distended. Stomach/Bowel: There is moderate to large sized fixed hiatal hernia. Small bowel loops are not dilated. Appendix is not distinctly seen. There is no pericecal inflammation. Multiple diverticula are seen in the colon. There is mild stranding in the fat planes adjacent to the sigmoid colon in the left lower pelvis. There is no loculated pericolic fluid collection. Vascular/Lymphatic: Fairly extensive arterial calcifications are seen. Reproductive: Uterus is not seen. Other: There is no ascites or pneumoperitoneum. Musculoskeletal: Decrease in height of body of L1 vertebra has not changed. Degenerative changes are noted in the lumbar spine, particularly severe at L4-L5 level. IMPRESSION: Diverticulosis of colon. There is pericolic stranding in the sigmoid colon in the left side of pelvis suggesting possible early or mild acute  diverticulitis. There is no loculated pericolic fluid collection. There is no evidence of intestinal obstruction or pneumoperitoneum. There is no hydronephrosis. There multiple tiny bilateral renal stones. There is poss

## 2022-03-31 NOTE — ED Triage Notes (Signed)
Patient c/o lower abd/pelvic pain, dysuria, and lower back pain x2 weeks. Patient does report nausea. Denies any vomiting, fever, or diarrhea. Patient recently taking bactrim for UTI at rehab in which she finished with no improvements in pain. Patient unsure if she had urine culture done when checked for UTI and has not had a repeat UA.  ?

## 2022-04-02 LAB — URINE CULTURE

## 2022-06-26 ENCOUNTER — Encounter (INDEPENDENT_AMBULATORY_CARE_PROVIDER_SITE_OTHER): Payer: Self-pay | Admitting: Gastroenterology

## 2022-06-26 ENCOUNTER — Ambulatory Visit (INDEPENDENT_AMBULATORY_CARE_PROVIDER_SITE_OTHER): Payer: Medicare Other | Admitting: Gastroenterology

## 2023-01-11 IMAGING — CT CT RENAL STONE PROTOCOL
2 of 4 series · 15 of 46 positions shown, 17 images · non-contrast
Comparison: 02/22/2022

CLINICAL DATA: Flank pain



[Series 2: axial st · axial · 0.91mm/px · z∈[+871,+1271]mm · 12 of 92 slices shown, 14 images]
[im 6/92  soft-tissue]
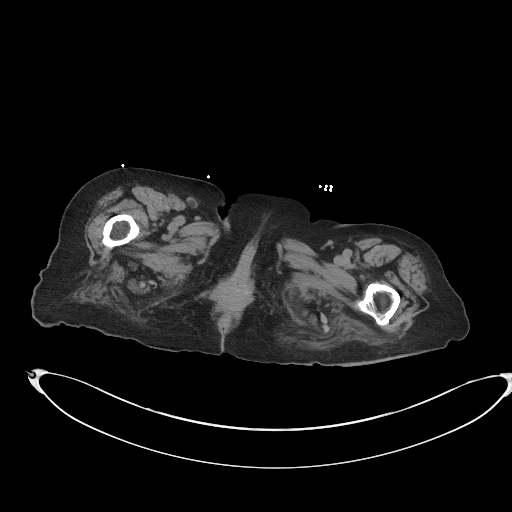
[im 6/92  bone]
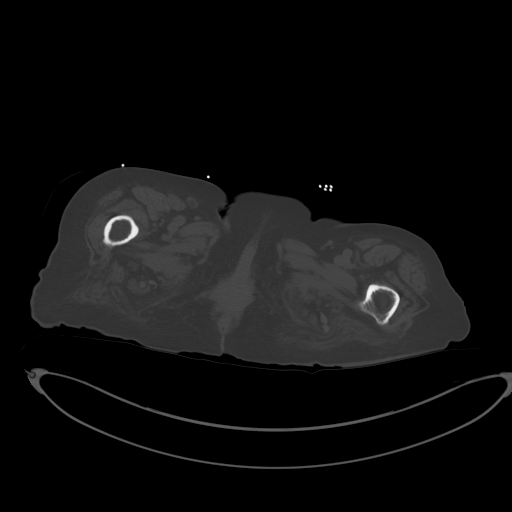
[im 12/92  soft-tissue]
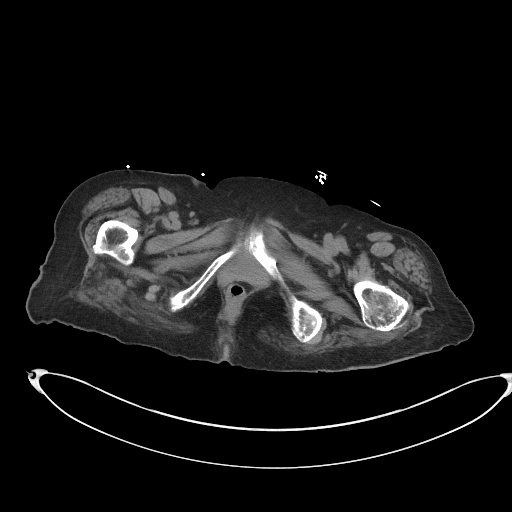
[im 23/92  soft-tissue]
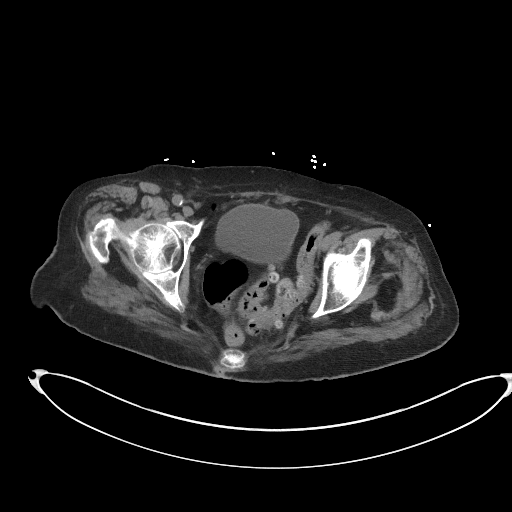
[im 29/92  soft-tissue]
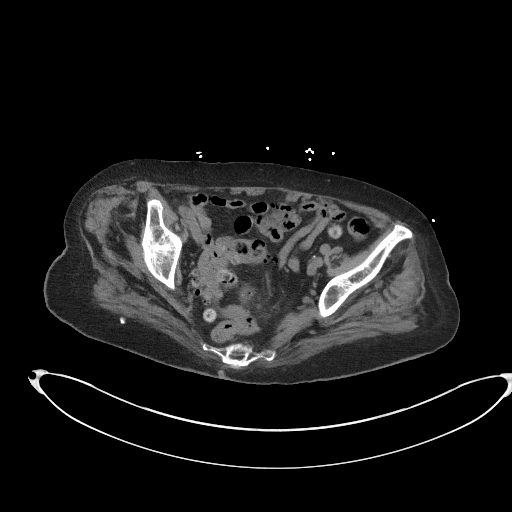
[im 35/92  soft-tissue]
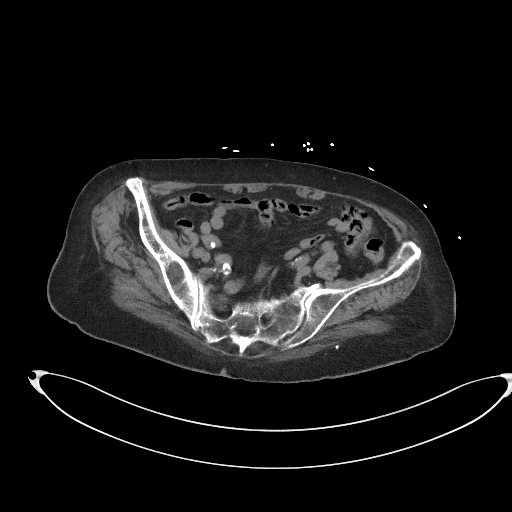
[im 40/92  soft-tissue]
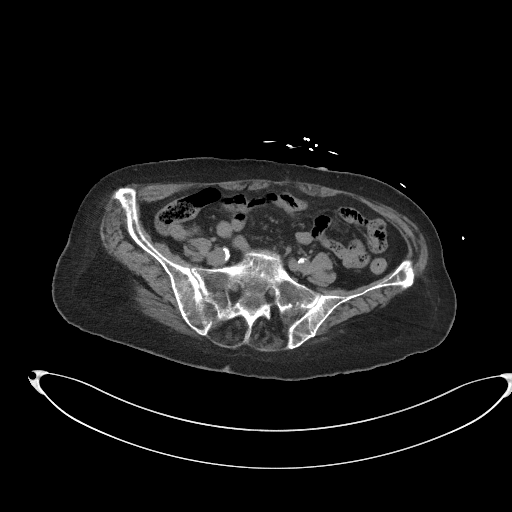
[im 52/92  soft-tissue]
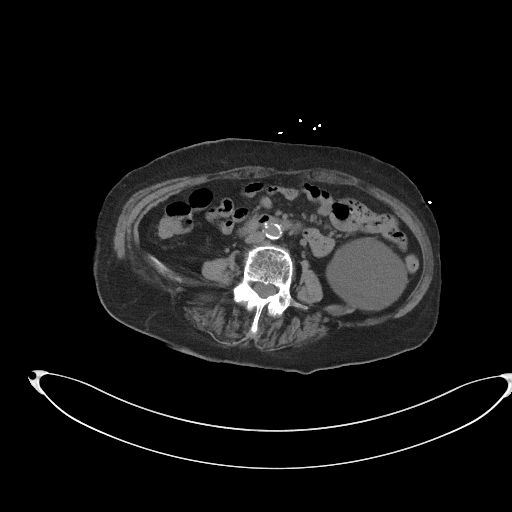
[im 57/92  soft-tissue]
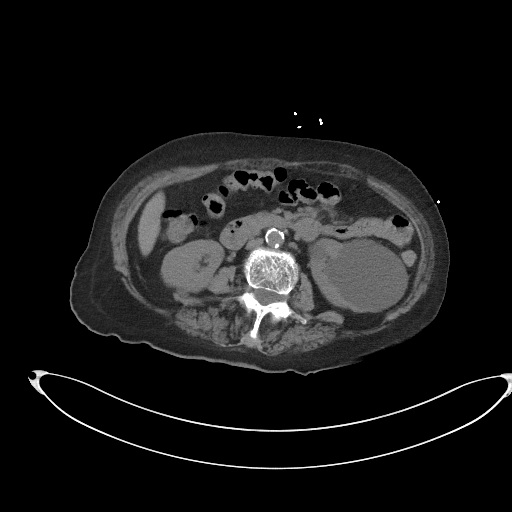
[im 63/92  soft-tissue]
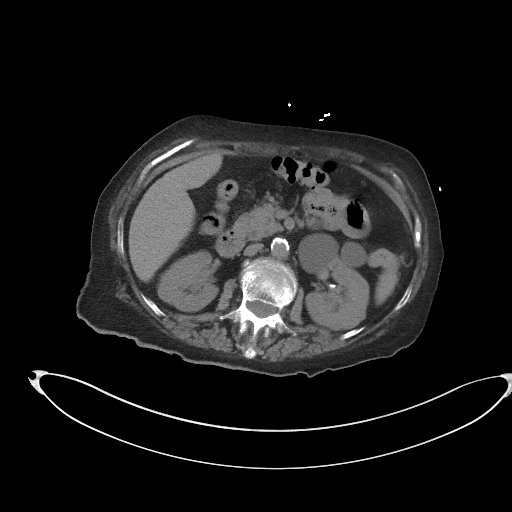
[im 63/92  bone]
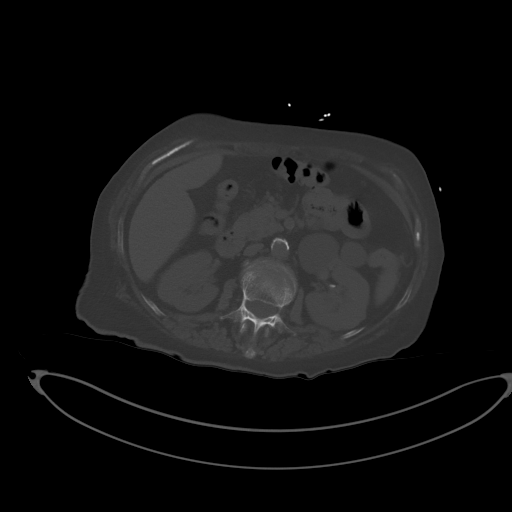
[im 69/92  soft-tissue]
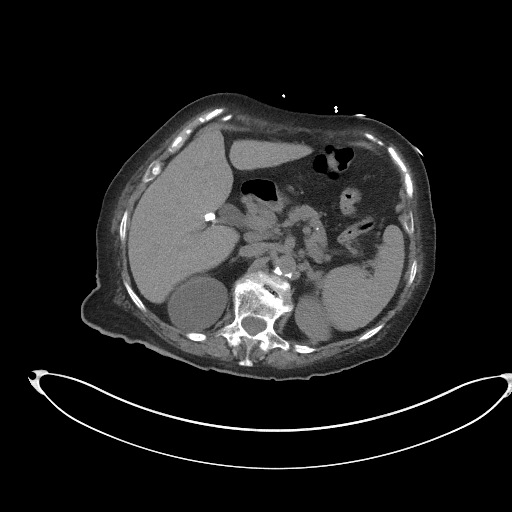
[im 80/92  soft-tissue]
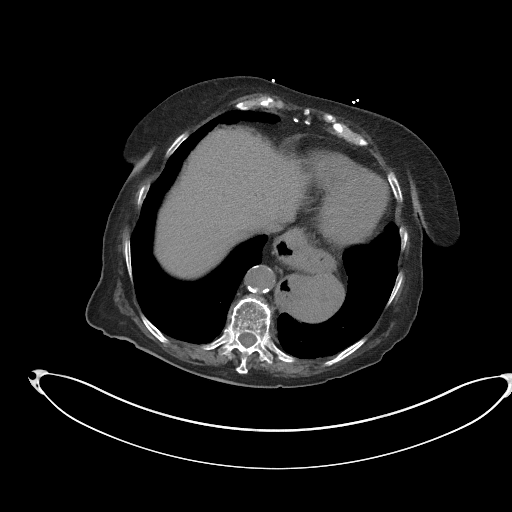
[im 86/92  soft-tissue]
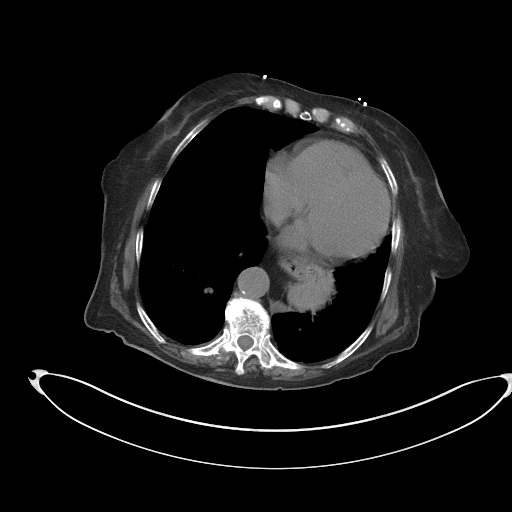

[Series 5: coronal st · coronal · 0.80mm/px · 3 of 107 slices shown]
[im 36/107  soft-tissue]
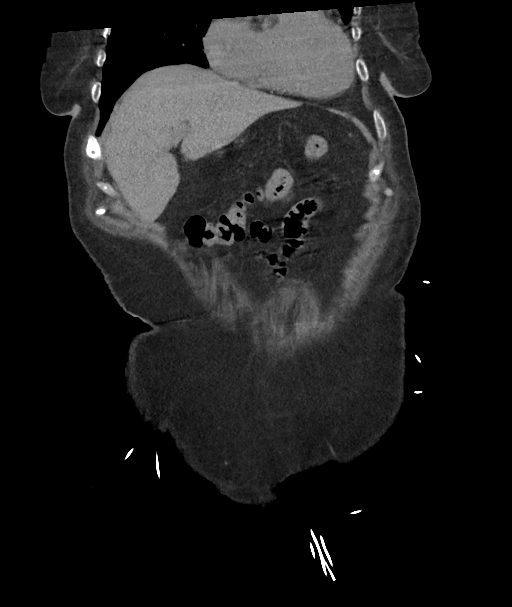
[im 48/107  soft-tissue]
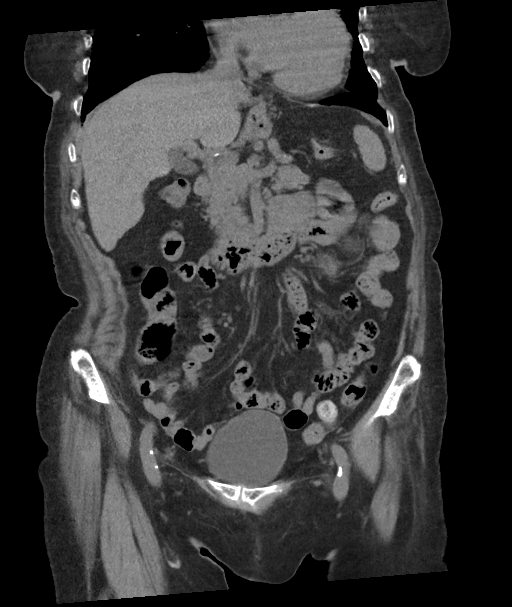
[im 59/107  soft-tissue]
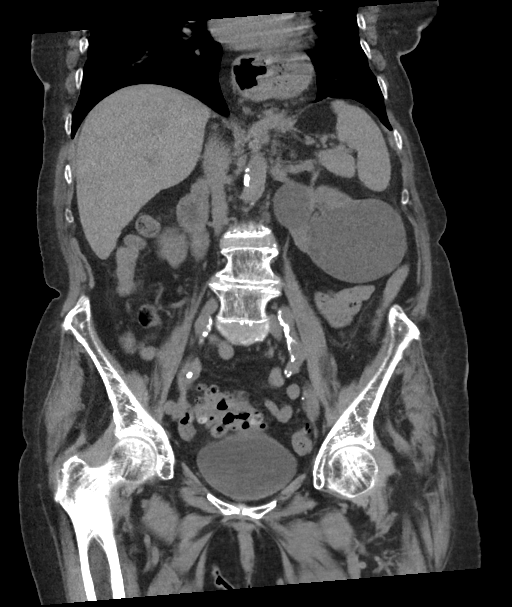

[15 of 46 positions shown; findings below may reference images not displayed]

FINDINGS: Lower chest: There is moderate to large fixed hiatal hernia.
Breathing motion limits evaluation of lower lung fields. There is no
pleural effusion. There are scattered coronary artery
calcifications.

Hepatobiliary: No focal abnormality is seen in the liver. Surgical
clips are seen in gallbladder fossa.

Pancreas: No focal abnormality is seen.

Spleen: Unremarkable.

Adrenals/Urinary Tract: Adrenals are unremarkable. There is no
hydronephrosis. There are multiple smooth marginated low-density
lesions in both kidneys largest measuring 7.7 cm in the lower pole
of left kidney suggesting renal cysts. There are few punctate
calcific densities in both kidneys. There is 7 mm calcific density
in the central portion of left kidney inseparable from renal artery
branch. This most likely is vascular calcification. Ureters are not
dilated. Urinary bladder is not distended.

Stomach/Bowel: There is moderate to large sized fixed hiatal hernia.
Small bowel loops are not dilated. Appendix is not distinctly seen.
There is no pericecal inflammation. Multiple diverticula are seen in
the colon. There is mild stranding in the fat planes adjacent to the
sigmoid colon in the left lower pelvis. There is no loculated
pericolic fluid collection.

Vascular/Lymphatic: Fairly extensive arterial calcifications are
seen.

Reproductive: Uterus is not seen.

Other: There is no ascites or pneumoperitoneum.

Musculoskeletal: Decrease in height of body of L1 vertebra has not
changed. Degenerative changes are noted in the lumbar spine,
particularly severe at L4-L5 level.
IMPRESSION: Diverticulosis of colon. There is pericolic stranding in the sigmoid
colon in the left side of pelvis suggesting possible early or mild
acute diverticulitis. There is no loculated pericolic fluid
collection.

There is no evidence of intestinal obstruction or pneumoperitoneum.
There is no hydronephrosis.

There multiple tiny bilateral renal stones. There is possible
arterial calcification in the left renal hilum. Bilateral renal
cysts.

Moderate to large fixed hiatal hernia. There are scattered coronary
artery calcifications.

Other findings as described in the body of the report.

## 2023-01-11 IMAGING — DX DG CHEST 2V
2 series · 2 of 2 positions shown · non-contrast
Comparison: 02/22/2022 and prior studies

CLINICAL DATA: Chest pain and nausea/vomiting.

EXAM:
CHEST - 2 VIEW

[chest pa]
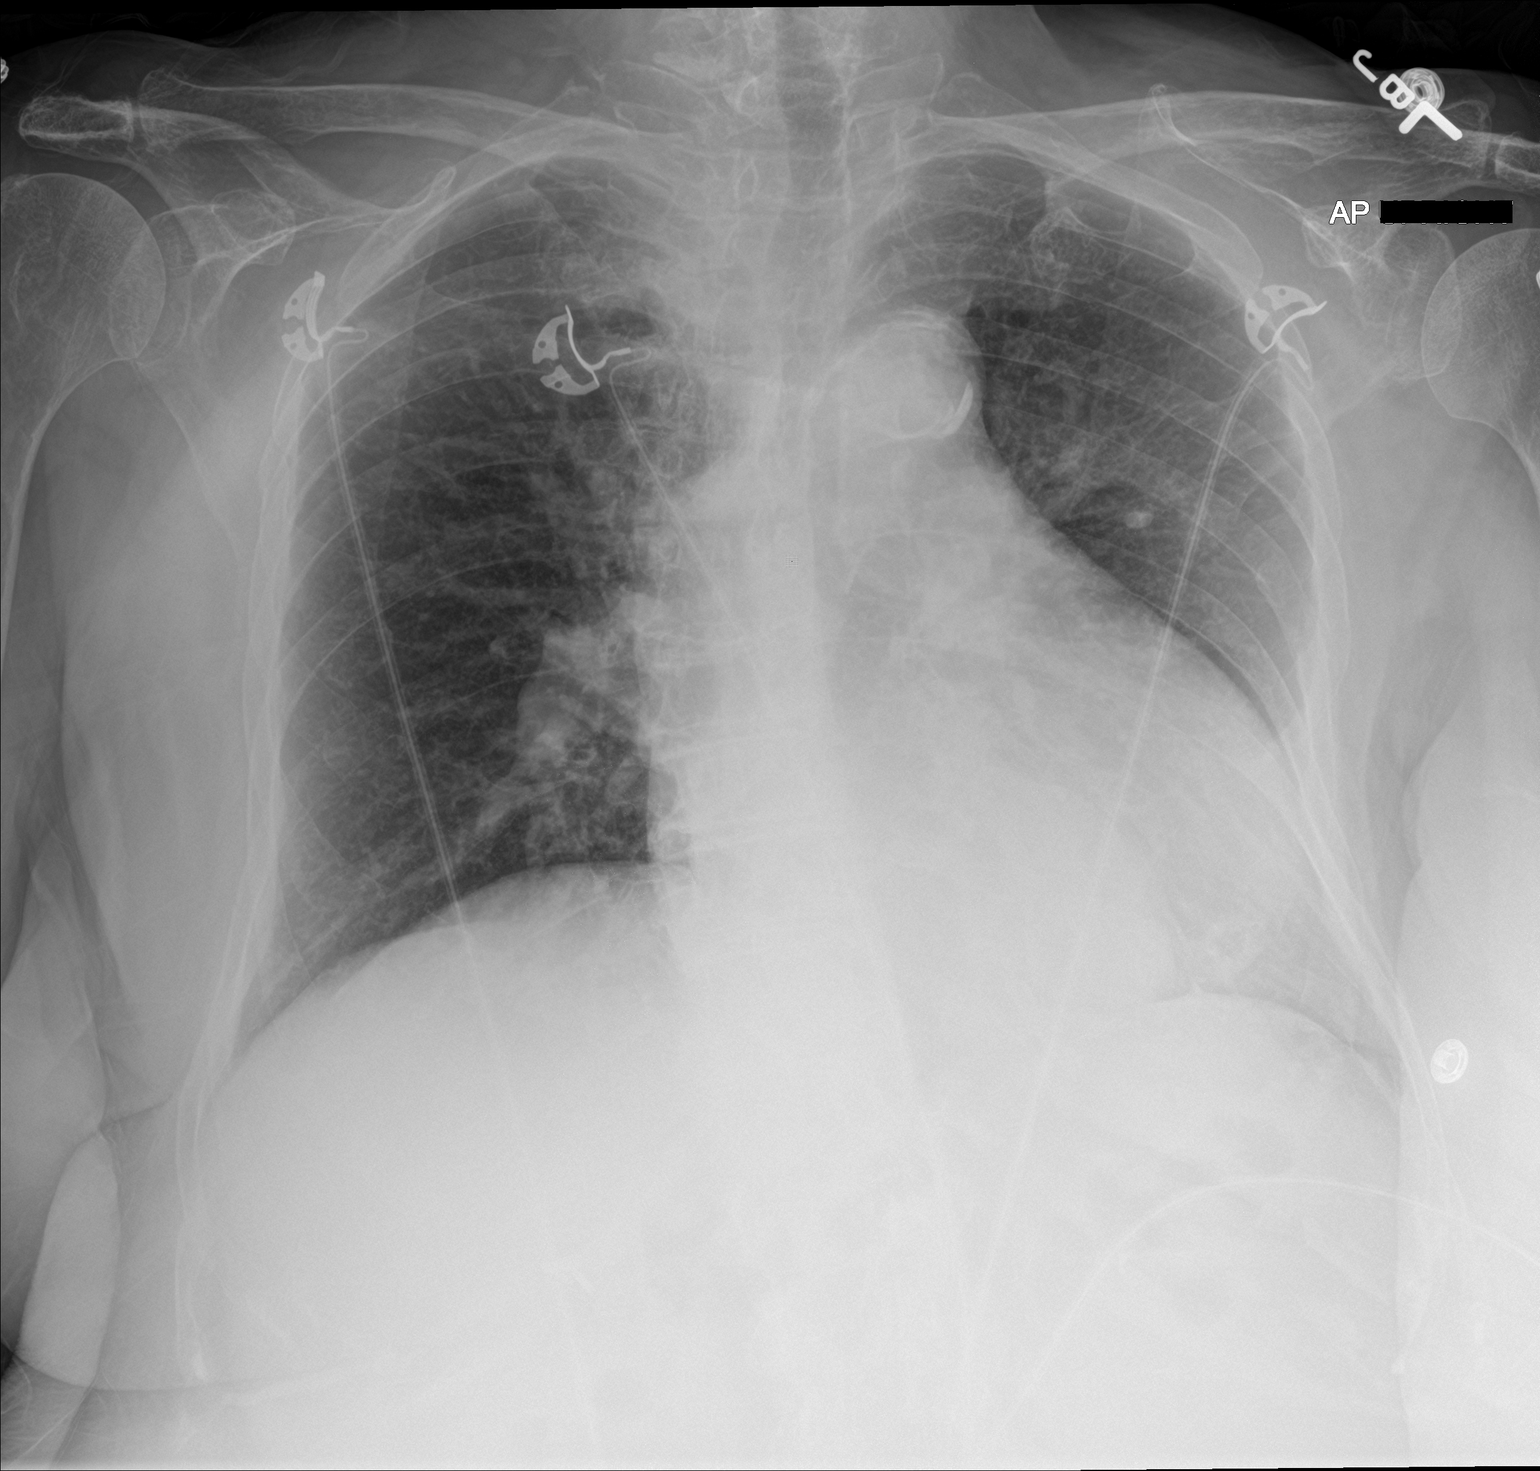

[chest lat]
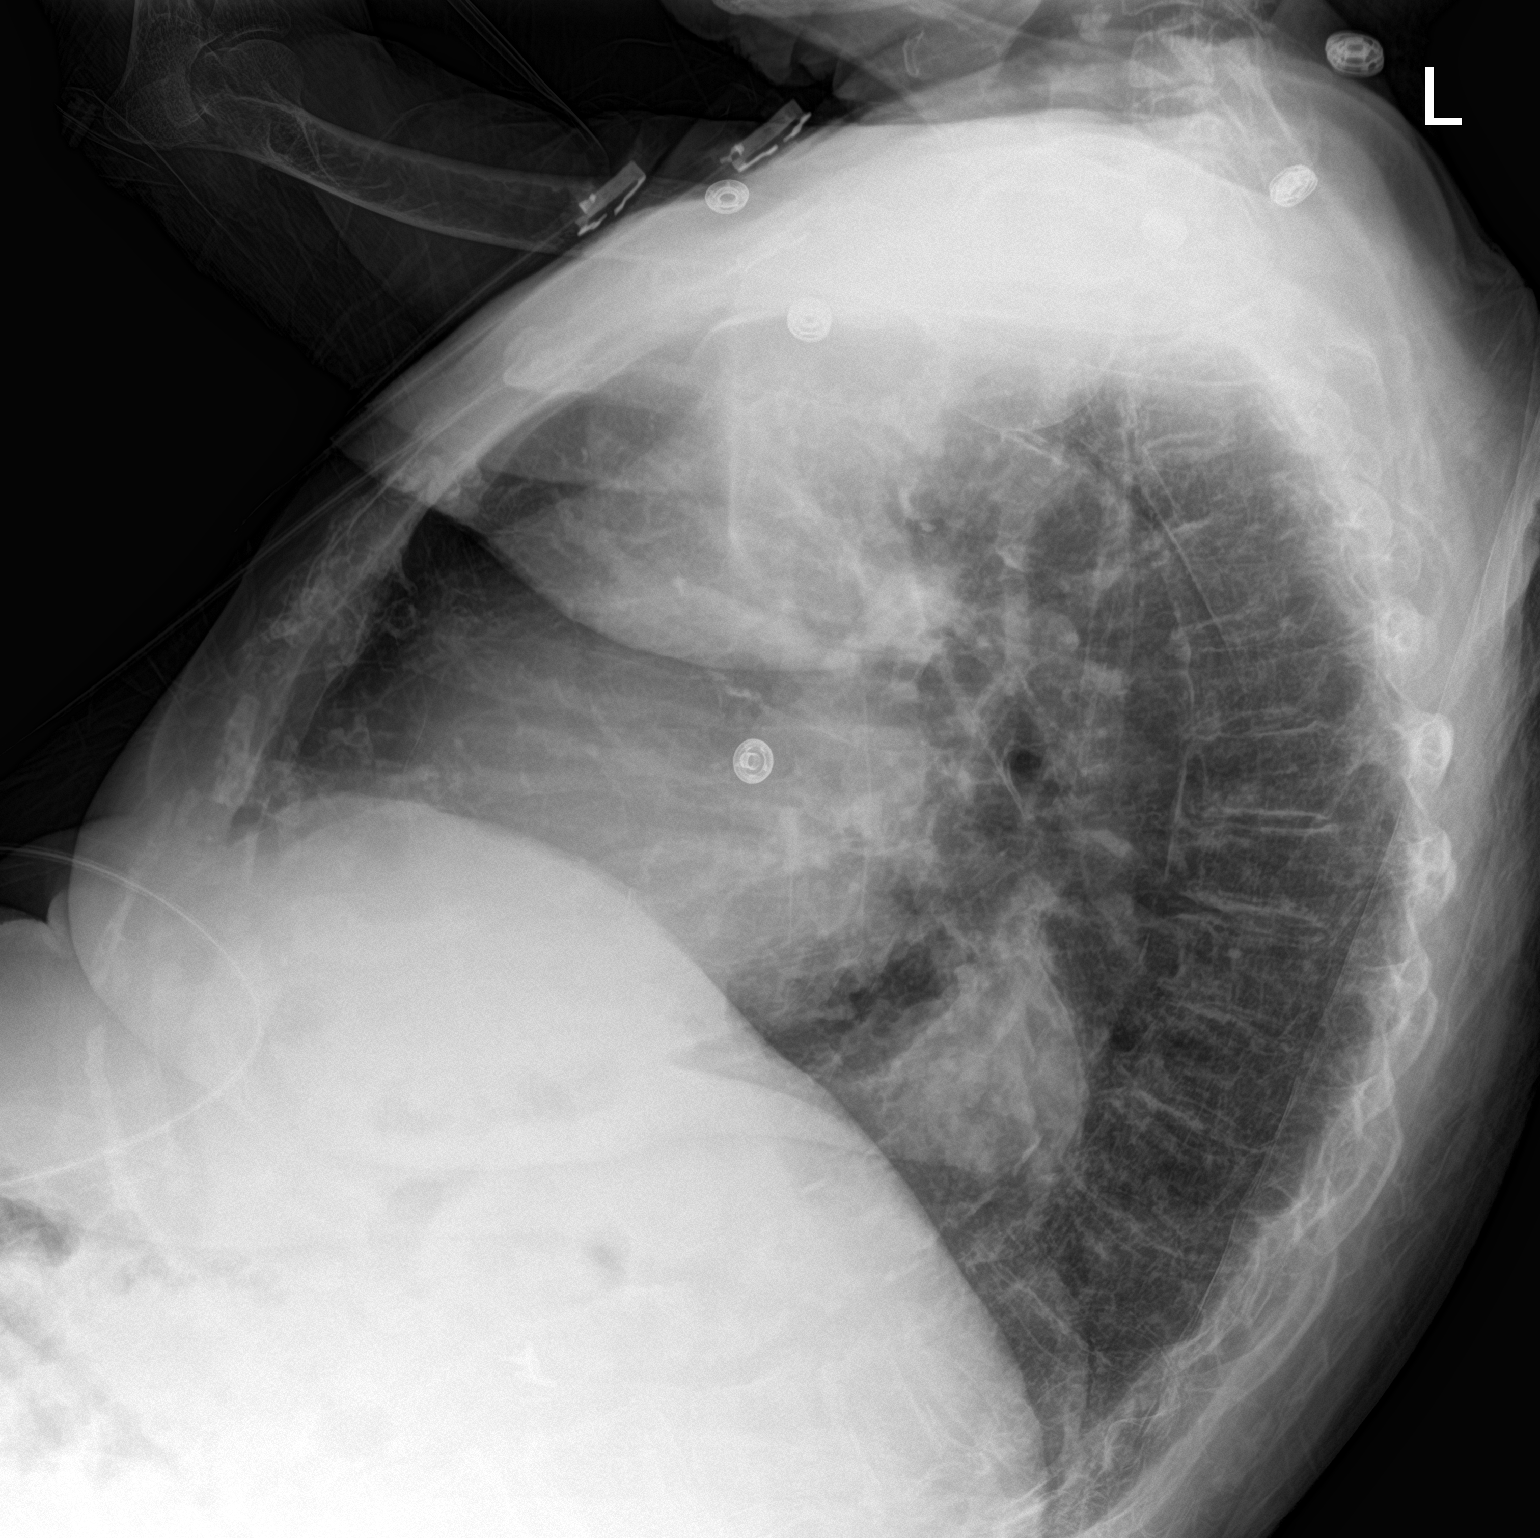

[2 of 2 positions shown; findings below may reference images not displayed]

FINDINGS: UPPER limits normal heart size again noted.

A large hiatal hernia is again identified.

There is no evidence of focal airspace disease, pulmonary edema,
suspicious pulmonary nodule/mass, pleural effusion, or pneumothorax.

No acute bony abnormalities are identified. An L1 compression
fracture is unchanged.
IMPRESSION: 1. No evidence of acute cardiopulmonary disease.
2. Large hiatal hernia.
